# Patient Record
Sex: Female | Born: 1935 | Race: White | Hispanic: No | Marital: Married | Smoking: Never smoker
Health system: Southern US, Community
[De-identification: ages and names within clinical notes are randomized; demographics above are authoritative.]

## PROBLEM LIST (undated history)

## (undated) DIAGNOSIS — F419 Anxiety disorder, unspecified: Secondary | ICD-10-CM

## (undated) DIAGNOSIS — R6521 Severe sepsis with septic shock: Secondary | ICD-10-CM

## (undated) DIAGNOSIS — I10 Essential (primary) hypertension: Secondary | ICD-10-CM

## (undated) DIAGNOSIS — F250 Schizoaffective disorder, bipolar type: Secondary | ICD-10-CM

## (undated) DIAGNOSIS — C801 Malignant (primary) neoplasm, unspecified: Secondary | ICD-10-CM

## (undated) DIAGNOSIS — M179 Osteoarthritis of knee, unspecified: Secondary | ICD-10-CM

## (undated) DIAGNOSIS — M1710 Unilateral primary osteoarthritis, unspecified knee: Secondary | ICD-10-CM

## (undated) DIAGNOSIS — K5730 Diverticulosis of large intestine without perforation or abscess without bleeding: Secondary | ICD-10-CM

## (undated) DIAGNOSIS — E785 Hyperlipidemia, unspecified: Secondary | ICD-10-CM

## (undated) DIAGNOSIS — A4151 Sepsis due to Escherichia coli [E. coli]: Secondary | ICD-10-CM

## (undated) HISTORY — DX: Essential (primary) hypertension: I10

## (undated) HISTORY — DX: Anxiety disorder, unspecified: F41.9

## (undated) HISTORY — DX: Hyperlipidemia, unspecified: E78.5

## (undated) HISTORY — DX: Schizoaffective disorder, bipolar type: F25.0

## (undated) HISTORY — DX: Severe sepsis with septic shock: R65.21

## (undated) HISTORY — DX: Osteoarthritis of knee, unspecified: M17.9

## (undated) HISTORY — DX: Sepsis due to Escherichia coli (e. coli): A41.51

## (undated) HISTORY — PX: APPENDECTOMY: SHX54

## (undated) HISTORY — PX: CHOLECYSTECTOMY: SHX55

## (undated) HISTORY — DX: Diverticulosis of large intestine without perforation or abscess without bleeding: K57.30

## (undated) HISTORY — DX: Malignant (primary) neoplasm, unspecified: C80.1

## (undated) HISTORY — DX: Unilateral primary osteoarthritis, unspecified knee: M17.10

---

## 2002-11-03 DIAGNOSIS — A4151 Sepsis due to Escherichia coli [E. coli]: Secondary | ICD-10-CM

## 2002-11-03 DIAGNOSIS — R6521 Severe sepsis with septic shock: Secondary | ICD-10-CM

## 2002-11-03 HISTORY — DX: Sepsis due to Escherichia coli (e. coli): R65.21

## 2002-11-03 HISTORY — DX: Severe sepsis with septic shock: A41.51

## 2004-06-22 ENCOUNTER — Other Ambulatory Visit: Payer: Self-pay

## 2004-06-25 ENCOUNTER — Other Ambulatory Visit: Payer: Self-pay

## 2004-10-30 ENCOUNTER — Ambulatory Visit: Payer: Self-pay | Admitting: Internal Medicine

## 2004-11-08 ENCOUNTER — Ambulatory Visit: Payer: Self-pay | Admitting: Internal Medicine

## 2005-04-08 ENCOUNTER — Emergency Department: Payer: Self-pay | Admitting: Emergency Medicine

## 2005-10-27 ENCOUNTER — Inpatient Hospital Stay: Payer: Self-pay | Admitting: Internal Medicine

## 2005-10-30 ENCOUNTER — Inpatient Hospital Stay: Payer: Self-pay | Admitting: Psychiatry

## 2006-02-26 ENCOUNTER — Ambulatory Visit: Payer: Self-pay | Admitting: Urology

## 2006-03-24 ENCOUNTER — Ambulatory Visit: Payer: Self-pay | Admitting: Vascular Surgery

## 2006-04-25 ENCOUNTER — Emergency Department (HOSPITAL_COMMUNITY): Admission: EM | Admit: 2006-04-25 | Discharge: 2006-04-25 | Payer: Self-pay | Admitting: Emergency Medicine

## 2006-04-28 ENCOUNTER — Inpatient Hospital Stay (HOSPITAL_COMMUNITY): Admission: RE | Admit: 2006-04-28 | Discharge: 2006-05-05 | Payer: Self-pay | Admitting: Psychiatry

## 2006-04-29 ENCOUNTER — Ambulatory Visit: Payer: Self-pay | Admitting: Psychiatry

## 2006-07-13 ENCOUNTER — Ambulatory Visit: Payer: Self-pay | Admitting: Family Medicine

## 2006-10-03 ENCOUNTER — Encounter: Payer: Self-pay | Admitting: Family Medicine

## 2006-10-03 LAB — CONVERTED CEMR LAB
Hgb A1c MFr Bld: 7.4 %
Microalbumin U total vol: 7.4 mg/L

## 2006-10-08 ENCOUNTER — Ambulatory Visit: Payer: Self-pay | Admitting: Family Medicine

## 2006-10-12 ENCOUNTER — Ambulatory Visit: Payer: Self-pay | Admitting: Family Medicine

## 2006-11-09 ENCOUNTER — Ambulatory Visit: Payer: Self-pay | Admitting: Family Medicine

## 2006-12-15 ENCOUNTER — Ambulatory Visit: Payer: Self-pay | Admitting: Family Medicine

## 2007-01-16 IMAGING — CT CT HEAD WITHOUT CONTRAST
2 series · 15 of 30 positions shown, 19 images · non-contrast
Comparison: none

REASON FOR EXAM: Rule out bleed, pt. fell
COMMENTS:  LMP: Post-Menopausal

[Series 2: without · axial · non-contrast · 0.39mm/px · z∈[+426,+546]mm · 13 of 30 slices shown, 17 images]
[im 3/30  brain]
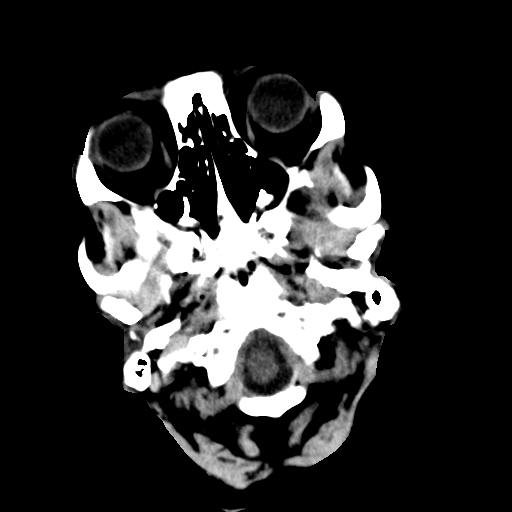
[im 3/30  bone]
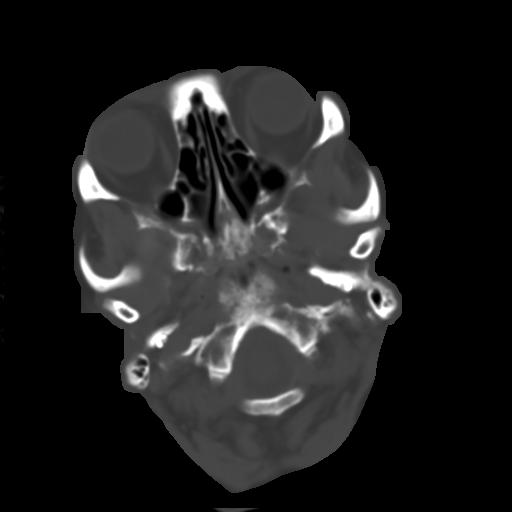
[im 5/30  brain]
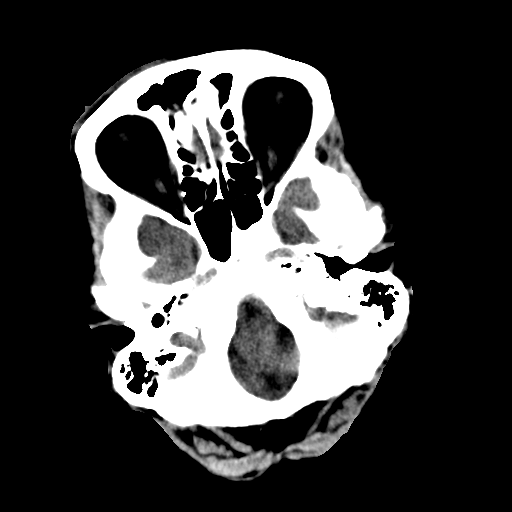
[im 7/30  brain]
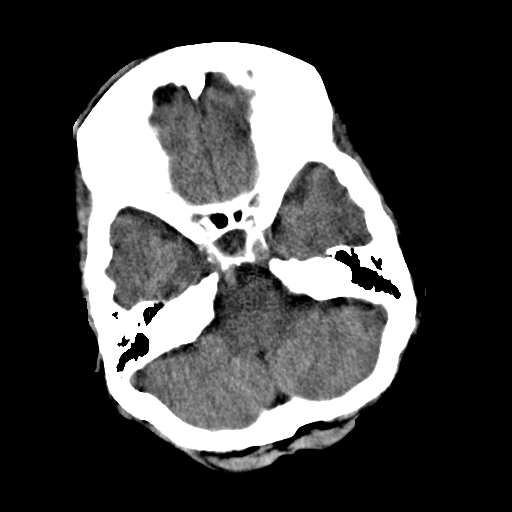
[im 9/30  brain]
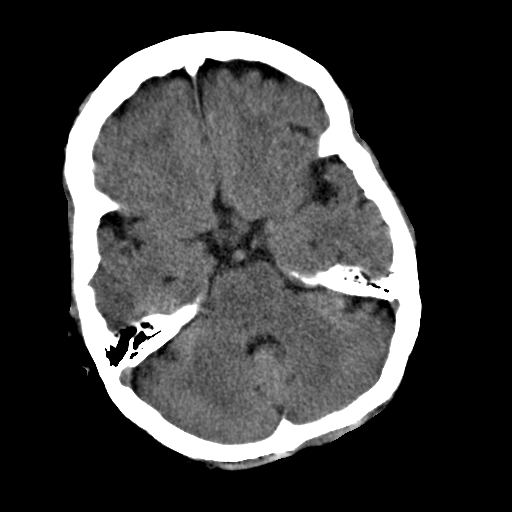
[im 11/30  brain]
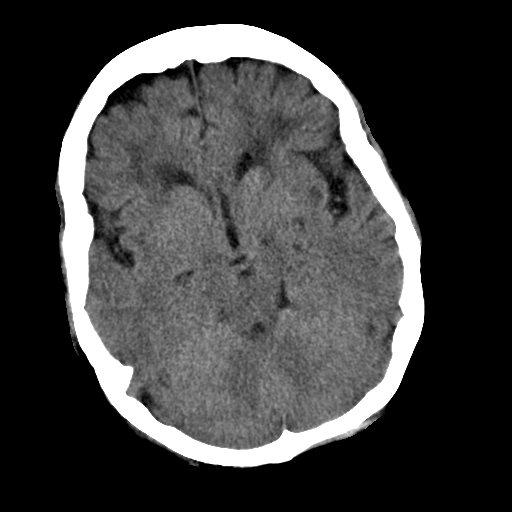
[im 11/30  bone]
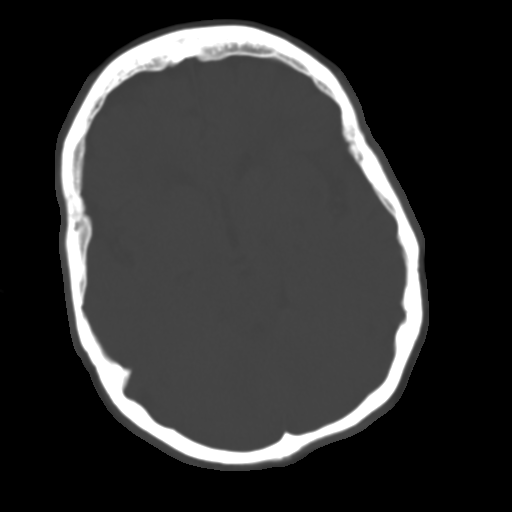
[im 13/30  brain]
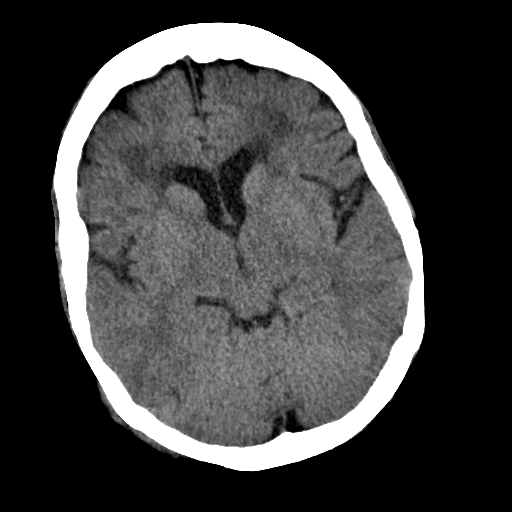
[im 15/30  brain]
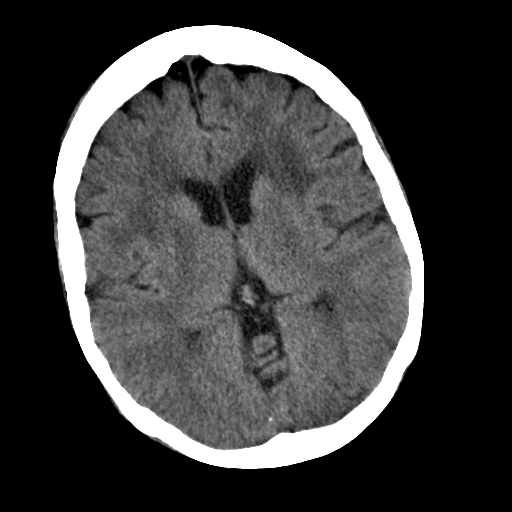
[im 17/30  brain]
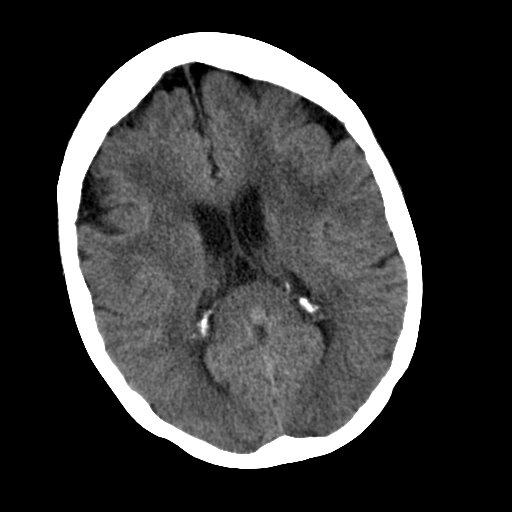
[im 19/30  brain]
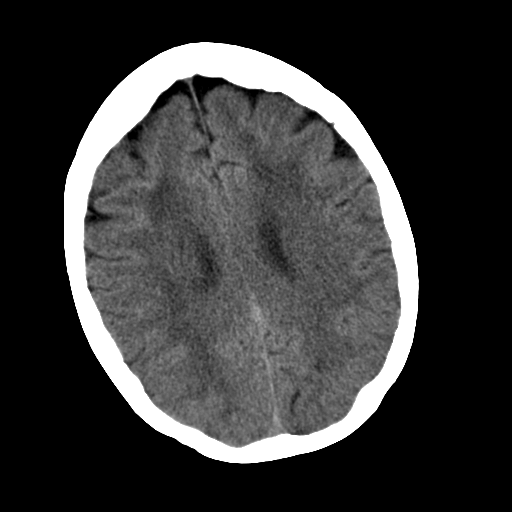
[im 19/30  bone]
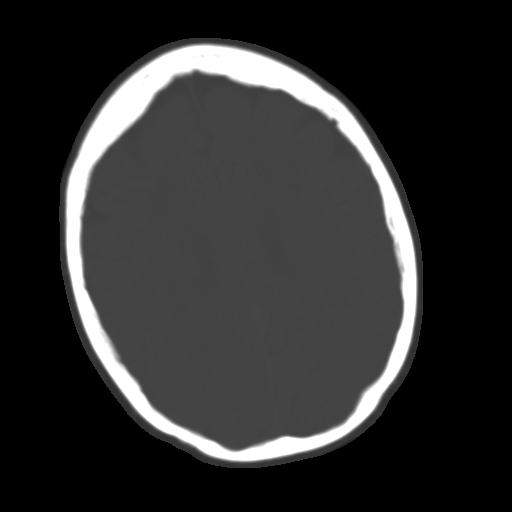
[im 21/30  brain]
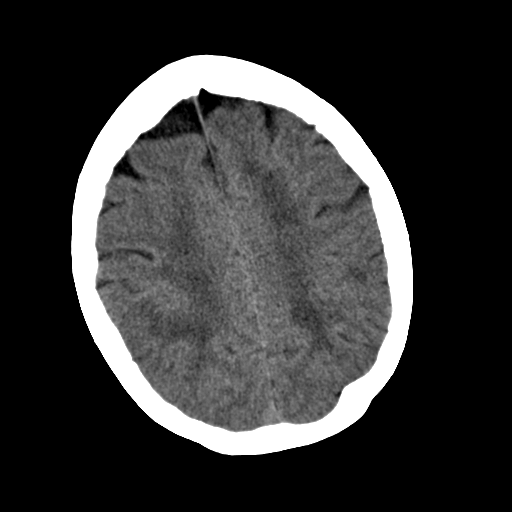
[im 23/30  brain]
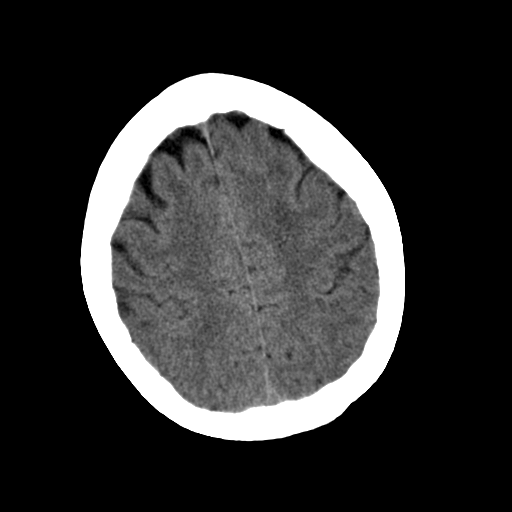
[im 25/30  brain]
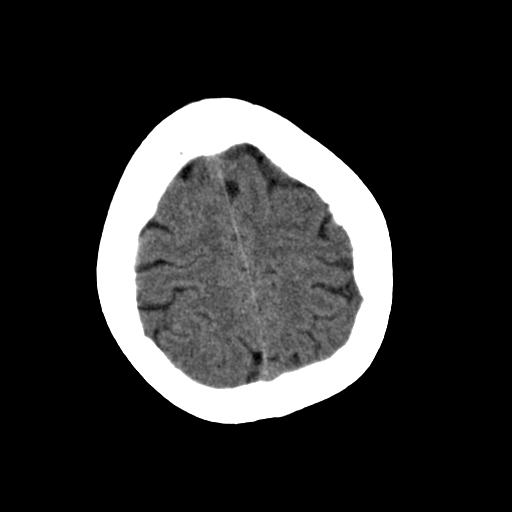
[im 27/30  brain]
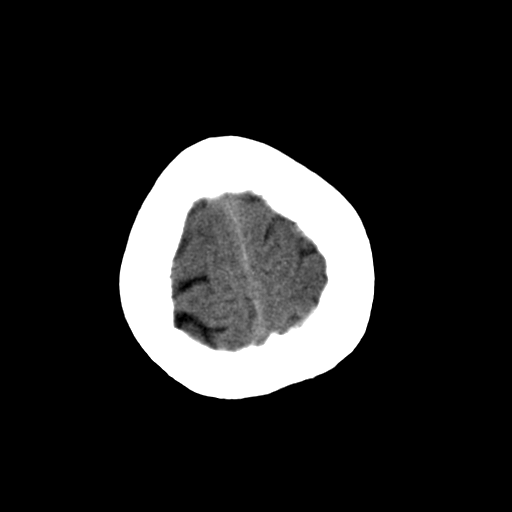
[im 27/30  bone]
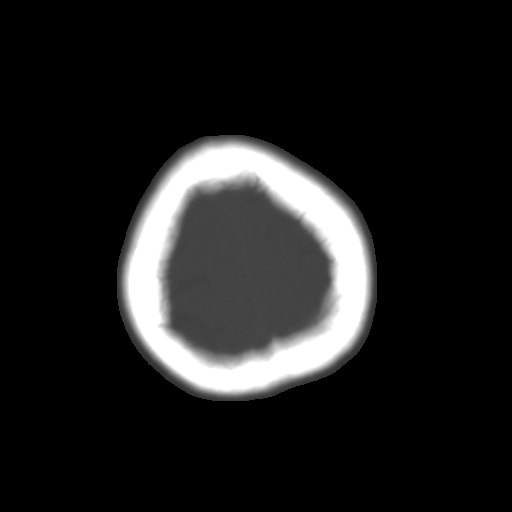

[Series 3: bone · axial · 0.39mm/px · z∈[+426,+446]mm · 2 of 30 slices shown]
[im 3/30  bone]
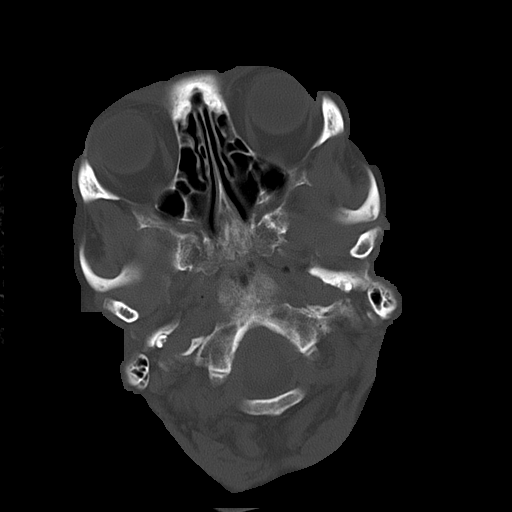
[im 7/30  bone]
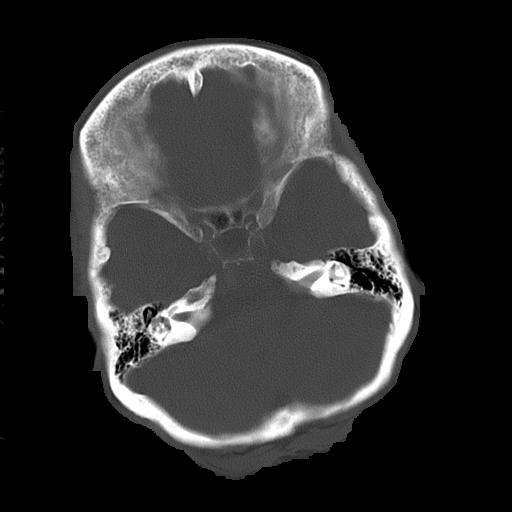

[15 of 30 positions shown; findings below may reference images not displayed]

PROCEDURE:     CT  - CT HEAD WITHOUT CONTRAST  - October 29, 2005  [DATE]

RESULT:       Comparison is made to the patient's prior study dated
10/28/05.  There appears to be increased soft tissue attenuation over the
RIGHT periorbital region consistent with subcutaneous hematoma.  The
ventricles and sulci are prominent consistent with atrophy and there is
ill-defined low density in the periventricular and subcortical white matter
which is nonspecific but most likely represents chronic microvascular
ischemic disease.  There is no evidence of parenchymal or extra-axial
hemorrhage.  There is no mass effect or midline shift.  No intraventricular
hemorrhage is evident.
IMPRESSION: 1.     Chronic microvascular ischemic disease.  No acute intracranial
abnormality.
2.     Bone window images do not show evidence of skull fracture.  The
mastoid air cells and paranasal sinuses included in the study appear to be
normally aerated.

## 2007-01-18 ENCOUNTER — Ambulatory Visit: Payer: Self-pay | Admitting: Family Medicine

## 2007-04-15 ENCOUNTER — Encounter: Payer: Self-pay | Admitting: Family Medicine

## 2007-04-15 DIAGNOSIS — F411 Generalized anxiety disorder: Secondary | ICD-10-CM | POA: Insufficient documentation

## 2007-04-15 DIAGNOSIS — IMO0002 Reserved for concepts with insufficient information to code with codable children: Secondary | ICD-10-CM | POA: Insufficient documentation

## 2007-04-15 DIAGNOSIS — E119 Type 2 diabetes mellitus without complications: Secondary | ICD-10-CM | POA: Insufficient documentation

## 2007-04-16 ENCOUNTER — Encounter (INDEPENDENT_AMBULATORY_CARE_PROVIDER_SITE_OTHER): Payer: Self-pay | Admitting: *Deleted

## 2007-04-19 ENCOUNTER — Ambulatory Visit: Payer: Self-pay | Admitting: Family Medicine

## 2007-07-08 ENCOUNTER — Ambulatory Visit: Payer: Self-pay | Admitting: Internal Medicine

## 2007-09-02 ENCOUNTER — Ambulatory Visit: Payer: Self-pay | Admitting: Internal Medicine

## 2007-09-10 ENCOUNTER — Ambulatory Visit: Payer: Self-pay | Admitting: Internal Medicine

## 2007-09-21 ENCOUNTER — Ambulatory Visit: Payer: Self-pay | Admitting: Internal Medicine

## 2007-10-25 ENCOUNTER — Inpatient Hospital Stay: Payer: Self-pay | Admitting: Unknown Physician Specialty

## 2007-10-25 ENCOUNTER — Other Ambulatory Visit: Payer: Self-pay

## 2007-11-12 ENCOUNTER — Encounter: Payer: Self-pay | Admitting: Family Medicine

## 2008-01-02 DIAGNOSIS — K5730 Diverticulosis of large intestine without perforation or abscess without bleeding: Secondary | ICD-10-CM

## 2008-01-02 HISTORY — DX: Diverticulosis of large intestine without perforation or abscess without bleeding: K57.30

## 2008-01-13 ENCOUNTER — Ambulatory Visit: Payer: Self-pay | Admitting: Gastroenterology

## 2008-01-18 LAB — HM COLONOSCOPY

## 2008-10-19 ENCOUNTER — Ambulatory Visit: Payer: Self-pay | Admitting: Internal Medicine

## 2009-01-11 IMAGING — CT CT HEAD WITHOUT CONTRAST
2 series · 15 of 30 positions shown, 19 images · non-contrast
Comparison: none

REASON FOR EXAM: ams
COMMENTS:

[Series 2: without · axial · non-contrast · 0.38mm/px · z∈[+928,+1048]mm · 13 of 29 slices shown, 17 images]
[im 3/29  brain]
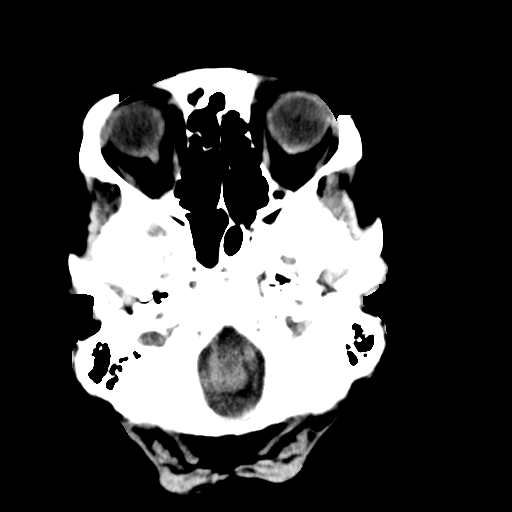
[im 3/29  bone]
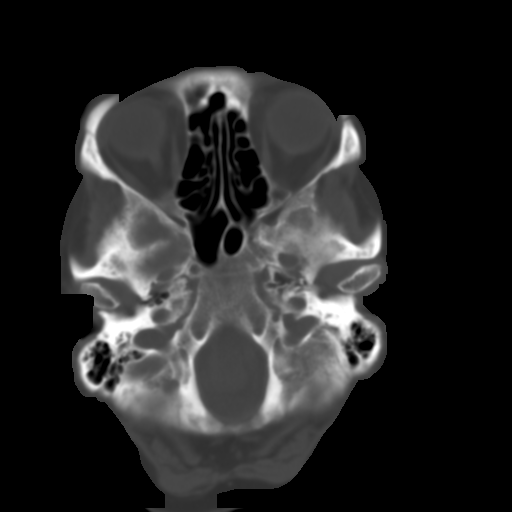
[im 5/29  brain]
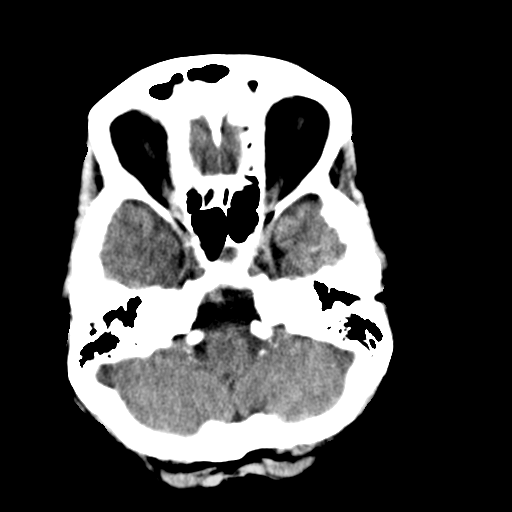
[im 7/29  brain]
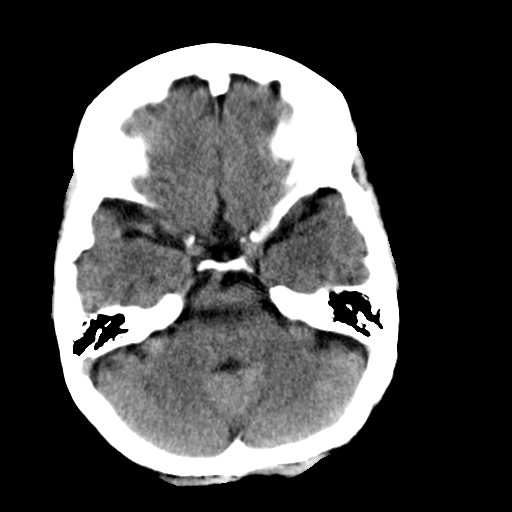
[im 9/29  brain]
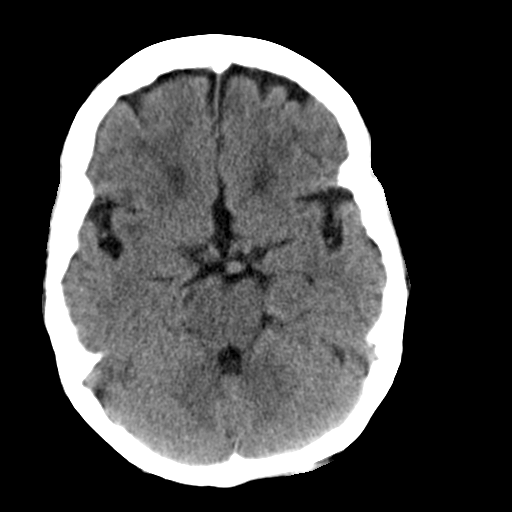
[im 11/29  brain]
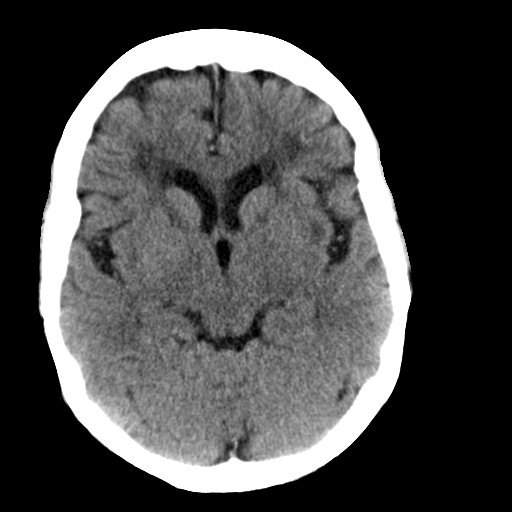
[im 11/29  bone]
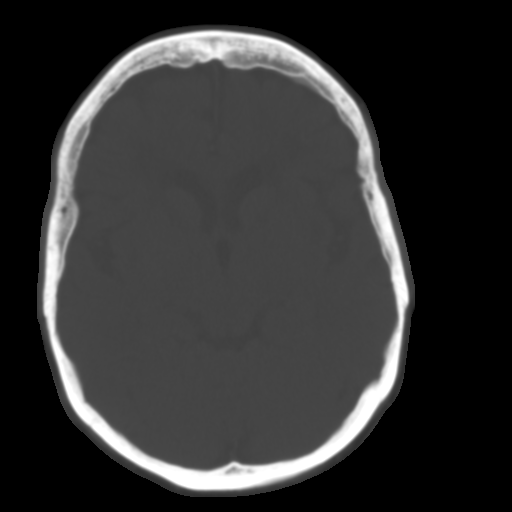
[im 13/29  brain]
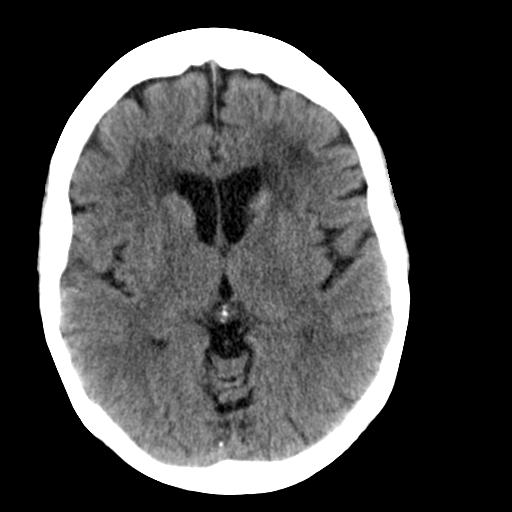
[im 15/29  brain]
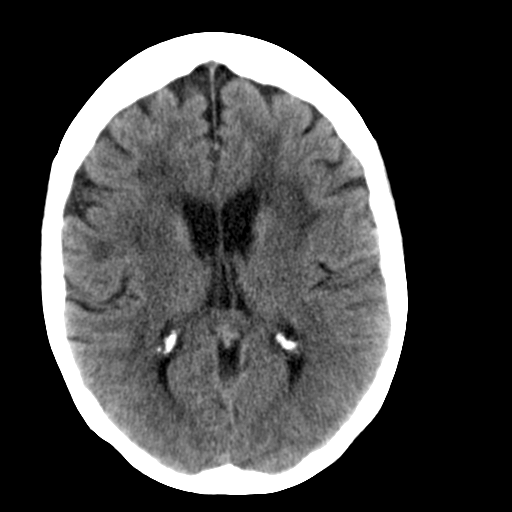
[im 17/29  brain]
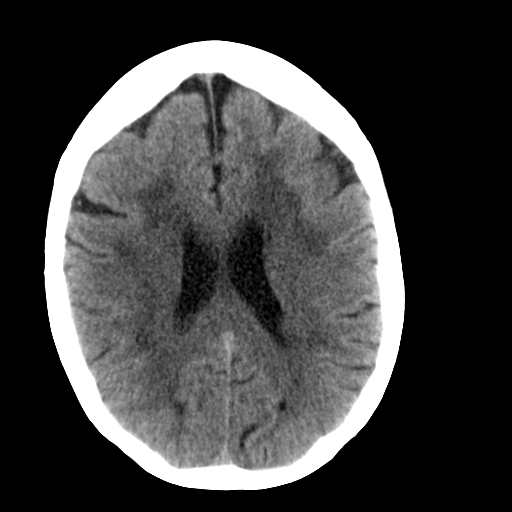
[im 19/29  brain]
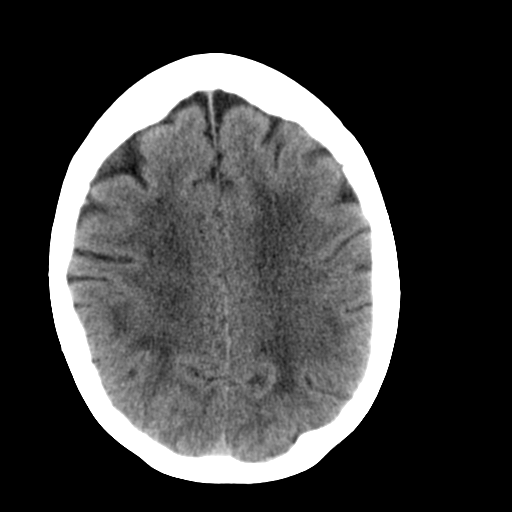
[im 19/29  bone]
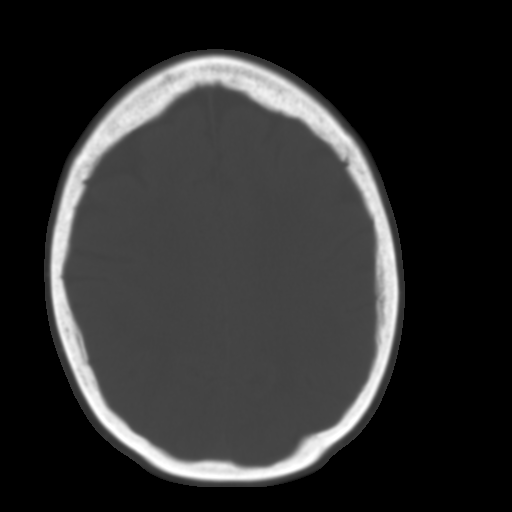
[im 21/29  brain]
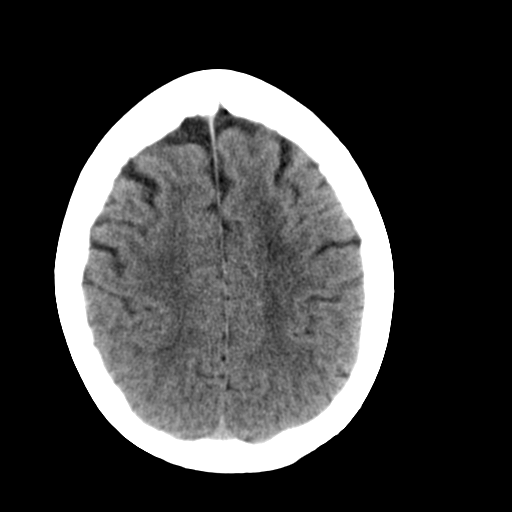
[im 23/29  brain]
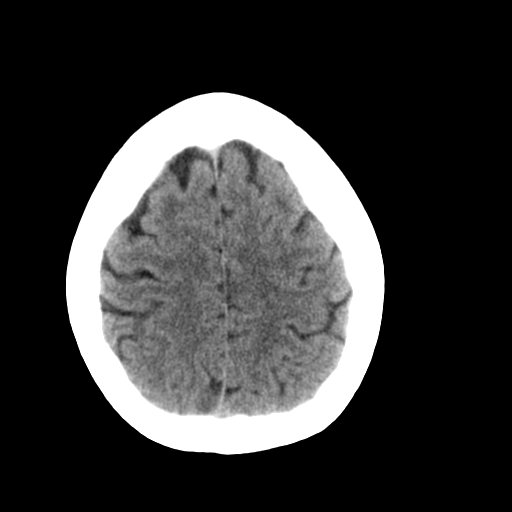
[im 25/29  brain]
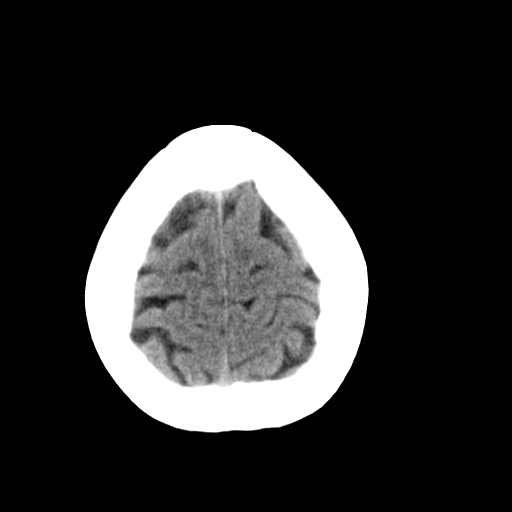
[im 27/29  brain]
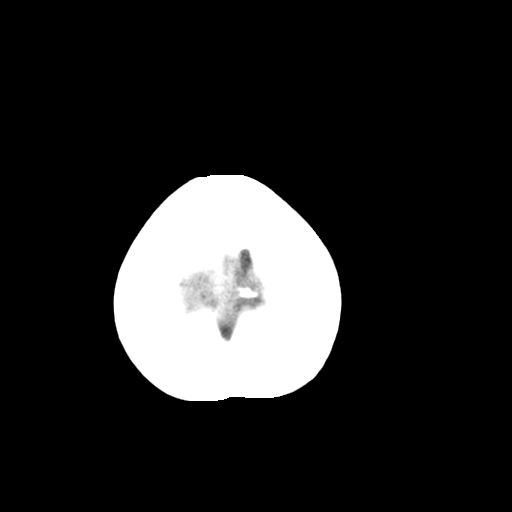
[im 27/29  bone]
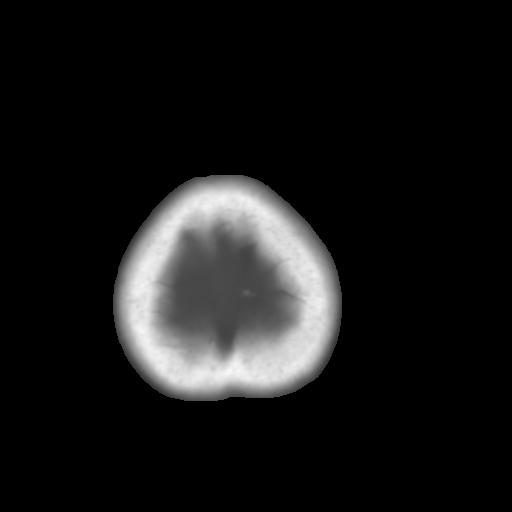

[Series 3: bone · axial · 0.38mm/px · z∈[+928,+948]mm · 2 of 29 slices shown]
[im 3/29  bone]
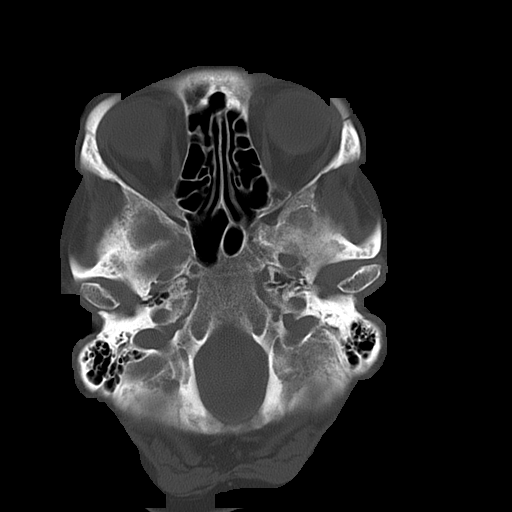
[im 7/29  bone]
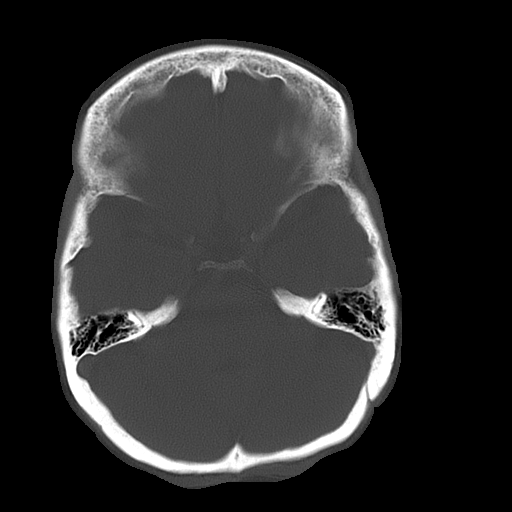

[15 of 30 positions shown; findings below may reference images not displayed]

PROCEDURE:     CT  - CT HEAD WITHOUT CONTRAST  - October 25, 2007 [DATE]

RESULT:     The ventricles are normal in size and position. There is mild
age-appropriate diffuse cerebral and cerebellar atrophy. There is no
intracranial hemorrhage, mass, or mass-effect. There is hypodensity in the
periventricular deep white matter consistent with chronic small vessel
ischemic change of aging.. At bone window settings the observed portions of
the paranasal sinuses are clear. There are no lytic or blastic bony lesions
identified.
IMPRESSION: 1. There is no evidence of an acute ischemic or hemorrhagic event.
2. There is no evidence of an intracranial mass or hydrocephalus.
3. There are age appropriate atrophic changes and deep white matter changes.
No significant interval change has occurred since study 29 October, 2005.

This report was called to the emergency department the conclusion of the
study.

## 2009-11-14 ENCOUNTER — Ambulatory Visit: Payer: Self-pay | Admitting: Internal Medicine

## 2010-03-03 DIAGNOSIS — C801 Malignant (primary) neoplasm, unspecified: Secondary | ICD-10-CM

## 2010-03-03 HISTORY — DX: Malignant (primary) neoplasm, unspecified: C80.1

## 2010-03-22 ENCOUNTER — Ambulatory Visit: Payer: Self-pay | Admitting: General Surgery

## 2010-03-27 ENCOUNTER — Ambulatory Visit: Payer: Self-pay | Admitting: General Surgery

## 2010-06-11 ENCOUNTER — Encounter (INDEPENDENT_AMBULATORY_CARE_PROVIDER_SITE_OTHER): Payer: Self-pay | Admitting: *Deleted

## 2010-12-03 NOTE — Letter (Signed)
Summary: Nadara Eaton letter  [employer] [facility]  [address] [place], [place] [address]   Phone: [phone]  Fax: [phone]       06/11/2010 MRN: [id number]  [address] [facility] [address] [place], [place]  [address]  Dear Ms. Binney,  [facility] [place] [place] announce the retirement of Arta Silence, M.D., from full-time practice at the [facility] office effective May 02, 2010 and his plans of returning part-time.  It is important to Dr. Hetty Ely and to our practice that you understand that [facility] has seven physicians in our office for your health care needs.  We will continue to offer the same exceptional care that you have today.    Dr. Hetty Ely has spoken to many of you about his plans for retirement and returning part-time in the fall.   We will continue to work with you through the transition to schedule appointments for you in the office and meet the high standards that [facility] is committed to.   Again, it is with great pleasure that we share the news that Dr. Hetty Ely will return to [facility] in October of 2011 with a reduced schedule.    If you have any questions, or would like to request an appointment with one of our physicians, please call us at [phone] and press the option for Scheduling an appointment.  We take pleasure in providing you with excellent patient care and look forward to seeing you at your next office visit.  Our [facility] Physicians are:  Tillman Abide, M.D. Laurita Quint, M.D. Roxy Manns, M.D. Kerby Nora, M.D. Hannah Beat, M.D. Ruthe Mannan, M.D. We proudly welcomed Raechel Ache, M.D. and Eustaquio Boyden, M.D. to the practice in July/August 2011.  Sincerely,  [facility]

## 2011-03-14 ENCOUNTER — Ambulatory Visit: Payer: Self-pay | Admitting: Internal Medicine

## 2011-03-14 LAB — HM MAMMOGRAPHY: HM Mammogram: NORMAL

## 2011-03-21 NOTE — Assessment & Plan Note (Signed)
[facility]                             [place] OFFICE NOTE   Margaret Salazar, Margaret Salazar                     MRN:          [id number]  DATE:07/13/2006                            DOB:          1932/01/10    A 77-two-year-old white female, new to practice, here to establish, with no  complaints.  She gets urinary tract infections frequently and they go to my  brain.  What she means is, she gets septic and then has problems with  knowing where she is and what she is doing.  She has been put on Zyprexa and  Klonopin in the past, which she thinks works because I am nervous and that  helps her.  She regularly sees Dr. Tiburcio Pea OB/GYN.  Last appointment with Dr.  Tiburcio Pea was approximately September of last year.   PAST MEDICAL HISTORY:  Hospitalized at [facility] in December 2006  for urinary sepsis; was treated medically for a few days and then  transferred to the psychiatric unit where she was treated.  She had right  hip pain secondary to a fall incurred there, and then got a recurrent  urinary tract infection after being treated in the psychiatric unit.  She  also was admitted for microhematuria with a workup in the past with a CT  scan that showed pancreatitis; was referred to a surgeon, which the  pancreatitis was ascertained to be a shadow on the CT, and she was followed  medically.  She also was hospitalized at [facility] in the  psychiatric unit by Dr. Mack Guise in the past.  She had her gallbladder  removed in 1962, an appendectomy at 77 years of age.  She denies rheumatic  fever, scarlet fever, strep infection, high blood pressure, heart disease,  asthma, pneumonia, diabetes, liver disease or thyroid disease.  She does not  know her cholesterol.   SHE IS ALLERGIC TO CODEINE WHICH CAUSES SWELLING OF THE FACE AND NOSE.   She has never smoked and does not drink or use drugs.   MEDICINES INCLUDE:  Zyprexa 5 mg a day and Klonopin 1 mg  b.i.d., both of  which she is out of right now.  Prior to the Klonopin, she took Valium for  28 years.  She takes generic Darvocet-N 100 for her knee pain, which has  been worked up by Dr. Reita Chard at [facility] and was found to  have Baker's cysts in the past, which were treated in an unknown fashion.  She also is on nitrofurantoin 100 mg a day, prophylactically, for recurrent  urinary infections.   REVIEW OF SYSTEMS:  Significant for resolved migraines; syncope at the  hospital with sepsis in the past; status post LASIK surgery in 1995, which  when her last exam was.  Last dental exam was January of this year.  She has  chronic cough in the past during this spring; has allergic rhinitis; flex  sig in 1991 at [facility] that was normal; microhematuria workup in the past that  was negative with frequent urinary infections for which she gets septic,  usually, because  she cannot tell if she has a urinary infection.  She is a  G2, P2 with her last Pap smear in September 2006; no abnormals in the past.  Her last mammogram was September 2006, as well.  She has had thickening of  her right breast and she has been menopausal since 77 years of age.  Otherwise, head, eyes, ears, nose and throat, teeth and gums  cardiorespiratory, gastrointestinal, GI, GU and gynecologic systems are  noncontributory.   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  Married and  lives with husband; has 2 children out of the home.   FAMILY HISTORY:  Father died at the age of 66 of heart disease, coronary  disease, and stroke.  Mother died at the age of 63 of stroke.  One brother  alive at 79 with lymphoma that was treated years ago successfully.  One  sister died at 63 years of age of a lymphoma.  Two sisters alive, one at 29,  one at 59; the younger diabetes with thyroid disease.  Last tetanus shot is  in 2002.   PHYSICAL EXAMINATION:  GENERAL:  This is a well-developed, well-nourished  77-  year-old white female in no acute distress.  VITAL SIGNS:  Temperature 97.4, pulse of 88, blood pressure 114/74, weight  of 134, 62 inches.  ALLERGY:  WITH ALLERGIES TO CODEINE.  HEENT:  Within normal limits.  NECK:  Without adenopathy.  THYROID:  Without nodularity.  LUNGS:  Clear to auscultation.  BACK:  Straight, nontender with no CVA tenderness.  HEART:  Regular rate and rhythm without murmur.  Pulses are 2+ throughout.  Carotids are without bruits.  CHEST:  Symmetric with good excursion.  ABDOMEN: Soft, nontender.  Good bowel sounds and no masses.  BREASTS, VAGINAL, AND RECTAL:  Exams were definitive therapy Dr. Tiburcio Pea.  EXTREMITIES:  Without clubbing, cyanosis, or edema.  Muscle strength is 5/5  with range of motion and gait mobility normal.  SKIN:  Significant only for actinic keratoses.   ASSESSMENT:  Frequent urinary tract infections with history of multiple  sepsis following suppression therapy, anxiety and agitation, chronic knee  pain, allergic rhinitis.   PLAN:  A trial of Astelin 2 sprays each nostril twice a day for a week and  then 1 spray each nostril twice a day; was given samples; call if it helps  and we will call in a prescription.  Back on her Zyprexa at 5 mg a day and  her Klonopin 1 mg t.i.d.; prescription for 30 and 12 and 90 and 2,  respectively.  Return in 3 months for recheck, sooner if symptoms require.                                   Arta Silence, MD   RNS/MedQ  DD:  07/13/2006  DT:  07/14/2006  Job #:  [id number]

## 2011-03-21 NOTE — Discharge Summary (Signed)
Margaret Salazar, Margaret Salazar              ACCOUNT NO.:  [id number]   MEDICAL RECORD NO.:  [id number]          PATIENT TYPE:  IPS   LOCATION:  0404                          FACILITY:  [facility]   PHYSICIAN:  Geoffery Lyons, M.D.      DATE OF BIRTH:  1932/10/15   DATE OF ADMISSION:  04/28/2006  DATE OF DISCHARGE:  05/05/2006                                 DISCHARGE SUMMARY   CHIEF COMPLAINT AND PRESENT ILLNESS:  It is the first admission to [facility] for this 77 year old married white female brought in  and admitted with manic behavior April 25, 2006, was disorganized,  combative, was given Versed and Ativan 2 mg and released to the care of her  daughter.  She continued to be disorganized, not able to sleep, brought back  to the hospital, disorganized in thought, rambling, nonpressured speech,  poor short term recall and history of mental problems, taking vitamin B12.   PAST PSYCHIATRIC HISTORY:  Margaret Salazar __________ , M.D. in [place].  Four  prior hospitalizations at [facility].  First time at  [facility].   According to history no active use of any substances.   MEDICAL HISTORY:  Urinary tract infection.   MEDICATIONS:  Vitamin B12 shots according to her report.   PHYSICAL EXAMINATION:  Performed but did not show any acute findings.   LABORATORY WORKUP:  Blood chemistry:  Sodium 136, potassium 3.4, glucose  153.  Hematocrit 47, hemoglobin 16, white blood cells 10.6.  Urinalysis:  Nitrite negative, leukocyte esterase moderate.  Urine:  White blood cells 3-  6 per high-power field.  Drug screening positive for benzodiazepines.   MENTAL STATUS EXAM:  Reveals a fully alert, cooperative female, __________,  pleasant, appropriate but disorganized.  She is rambling, mumbling, fast  paced, normal tone, fast production.  Mood anxious, somewhat irritable,  upset with being in the unit, issues of trust, not wanting medications.  Somewhat disorganized.   Cognition well preserved.   ADMISSION DIAGNOSES:  Axis I:  Bipolar disorder.  Axis II:  No diagnosis.  Axis III:  Rule out urinary tract infection.  Axis IV:  Moderate.  Axis V:  Upon admission 30, GAF in the last year is 65.   COURSE IN HOSPITAL:  She was admitted.  She was started in individual  psychotherapy.  She was initially given some Zyprexa at bedtime.  She was  given Ativan as needed.  She was also given some Klonopin.  Initially she  was agitated, irrational thinking and behavior, disorganized, pressure of  speech.  Endorsed none of the symptoms that led to her admission.  Claimed  that she was poisoned by Dr. Lenis Salazar,  according to my mother as she was  given Seroquel by him.  She gained weight, saying that Dr. Lenis Salazar was trying  to kill her.  Does not trust any of the psychiatrists in town. Diagnosed  with schizoaffective disorder, not compliant with medications, endorsed that  she __________,  some grandiosity, living with the husband and claims a good  relationship with the husband.  Her son is 90  years old.  He has been having  some difficulties, claimed drugs and some other legal issues, worried about  him.  Claimed that her husband and her daughter see things the same way and  they usually gang against her and thought that she needed to be unit.  She  denies __________ , cannot validate the need to be in the hospital.   On evaluation though she is __________  disorganized __________  up to being  tangential.  She continued to evidence some pressured speech, somewhat  superficial.  She wanted to be discharged but she was at the same time  compliant with the medication that she was being given.  There was a lot of  worrying, a lot of rumination about relationship with the husband, though  she needed to be in the unit.  __________  circumstantial, some grandiosity.  She continued to get more stable.  She was compliant with medications.  May 04, 2006 she still __________   she wanted to be discharged.  Objectively she  was less circumstantial, less tangential and thus she was sleeping better.  She was willing to continue the medications.  So by May 05, 2006 she was  most possibly baseline.  She was in full contact with reality, in many ways  better, did not sleep, anticipating discharge, wanting to be home.  She was  willing to pursue the medications.  There were no acute suicidal/homicidal  ideations for what we went ahead and discharged to outpatient followup.   DISCHARGE DIAGNOSES:  Axis I.  Schizoaffective disorder.  Axis II:  No diagnosis.  Axis III:  Urinary tract infection.  Axis IV:  Moderate.  Axis V:  Upon discharge 50.   __________  1 mg twice a day, Macrodantin 100 daily, Ambien CR 12.5 at  bedtime as needed.  Follow with Dr. __________, psychiatrist in [place].      Geoffery Lyons, M.D.  Electronically Signed     IL/MEDQ  D:  05/18/2006  T:  05/19/2006  Job:  [id number]

## 2011-05-28 ENCOUNTER — Ambulatory Visit: Payer: Self-pay | Admitting: Internal Medicine

## 2011-06-10 ENCOUNTER — Encounter: Payer: Self-pay | Admitting: Internal Medicine

## 2011-06-27 ENCOUNTER — Other Ambulatory Visit: Payer: Self-pay | Admitting: Internal Medicine

## 2011-06-27 MED ORDER — CARISOPRODOL 350 MG PO TABS: 350.0000 mg | ORAL_TABLET | Freq: Three times a day (TID) | ORAL | Status: AC | PRN

## 2011-07-25 ENCOUNTER — Other Ambulatory Visit: Payer: Self-pay | Admitting: Internal Medicine

## 2011-07-26 MED ORDER — CARISOPRODOL 350 MG PO TABS: 350.0000 mg | ORAL_TABLET | Freq: Three times a day (TID) | ORAL | Status: DC | PRN

## 2011-07-28 MED ORDER — CARISOPRODOL 350 MG PO TABS: 350.0000 mg | ORAL_TABLET | Freq: Three times a day (TID) | ORAL | Status: AC | PRN

## 2011-08-01 ENCOUNTER — Other Ambulatory Visit: Payer: Self-pay | Admitting: Internal Medicine

## 2011-08-01 DIAGNOSIS — F319 Bipolar disorder, unspecified: Secondary | ICD-10-CM

## 2011-08-01 NOTE — Telephone Encounter (Signed)
Patient spouse called stating the patient requested this rx 3 days ago.Loletta Specter) Patient has been without meds for 2 days.

## 2011-08-02 MED ORDER — QUETIAPINE FUMARATE 50 MG PO TABS: 50.0000 mg | ORAL_TABLET | Freq: Two times a day (BID) | ORAL | Status: DC

## 2011-08-02 NOTE — Telephone Encounter (Signed)
I have revirwed the incoming refill requests and it was never received.  Done now.

## 2011-08-05 ENCOUNTER — Other Ambulatory Visit: Payer: Self-pay | Admitting: Internal Medicine

## 2011-08-11 ENCOUNTER — Other Ambulatory Visit: Payer: Self-pay | Admitting: Internal Medicine

## 2011-08-18 ENCOUNTER — Ambulatory Visit (INDEPENDENT_AMBULATORY_CARE_PROVIDER_SITE_OTHER): Payer: Self-pay | Admitting: Internal Medicine

## 2011-08-18 ENCOUNTER — Encounter: Payer: Self-pay | Admitting: Internal Medicine

## 2011-08-18 DIAGNOSIS — Z9889 Other specified postprocedural states: Secondary | ICD-10-CM | POA: Insufficient documentation

## 2011-08-18 DIAGNOSIS — E119 Type 2 diabetes mellitus without complications: Secondary | ICD-10-CM

## 2011-08-18 LAB — COMPREHENSIVE METABOLIC PANEL
ALT: 18 U/L (ref 0–35)
CO2: 25 mEq/L (ref 19–32)
Calcium: 9.6 mg/dL (ref 8.4–10.5)
Chloride: 104 mEq/L (ref 96–112)
Creatinine, Ser: 0.7 mg/dL (ref 0.4–1.2)
GFR: 90.53 mL/min (ref >60.00)
Total Protein: 7.7 g/dL (ref 6.0–8.3)

## 2011-08-18 LAB — HEMOGLOBIN A1C: Hgb A1c MFr Bld: 7.1 % — ABNORMAL HIGH (ref 4.6–6.5)

## 2011-08-18 LAB — MICROALBUMIN / CREATININE URINE RATIO: Microalb Creat Ratio: 1 mg/g (ref 0.0–30.0)

## 2011-08-18 NOTE — Patient Instructions (Addendum)
Make sure you sign a release from so we can obtain your old records from our previous office.   You should get a flu vaccine and a pneumonia vaccine at [facility] this Fall.   I also recommend the TdaP (tetanus, diptheria, pertussis) vaccine which is availabe at the [facility].   We will call you with your lab results.

## 2011-08-18 NOTE — Progress Notes (Signed)
  Subjective:    Patient ID: Margaret Salazar, female    DOB: 23-May-1932, 77 y.o.   MRN: [id number]  HPI    Review of Systems     Objective:   Physical Exam        Assessment & Plan:   Subjective:     Margaret Salazar is a 77 y.o. female who presents for follow up of diabetes.. Current symptoms include: none. Patient denies foot ulcerations, hypoglycemia  and paresthesia of the feet. Evaluation to date has been: fasting blood sugar, fasting lipid panel, hemoglobin A1C and microalbuminuria. Home sugars: patient does not check sugars. Current treatments: Continued metformin which has been effective. Last dilated eye exam over 2 years ago  The following portions of the patient's history were reviewed and updated as appropriate: allergies, current medications, past family history, past medical history, past social history, past surgical history and problem list.  Review of Systems A comprehensive review of systems was negative.    Objective:    BP 138/80  Pulse 88  Temp(Src) 98.7 F (37.1 C) (Oral)  Ht 5\' 2"  (1.575 m)  Wt 135 lb 8 oz (61.462 kg)  BMI 24.78 kg/m2 General appearance: alert Eyes: conjunctivae/corneas clear. PERRL, EOM's intact. Fundi benign. Throat: lips, mucosa, and tongue normal; teeth and gums normal Neck: no adenopathy, no carotid bruit, no JVD, supple, symmetrical, trachea midline and thyroid not enlarged, symmetric, no tenderness/mass/nodules Lungs: clear to auscultation bilaterally Heart: regular rate and rhythm, S1, S2 normal, no murmur, click, rub or gallop Abdomen: soft, non-tender; bowel sounds normal; no masses,  no organomegaly Extremities: extremities normal, atraumatic, no cyanosis or edema Pulses: 2+ and symmetric Skin: Skin color, texture, turgor normal. No rashes or lesions    @DMFOOTEXAM @  Patient was evaluated for proper footwear and sizing.  Laboratory: No components found with this basename: A1C      Assessment:    Diabetes  mellitus Type II, under excellent control.    Plan:    Discussed general issues about diabetes pathophysiology and management. Encouraged aerobic exercise. Reminded to get yearly retinal exam. Continued sulfonylurea; see medication orders.

## 2011-08-18 NOTE — Assessment & Plan Note (Signed)
Reminded to have her annual eye exam.  Last one was 2 yrs ago with dr bell, but she does not want  To see him again or go to [place]

## 2011-08-25 ENCOUNTER — Other Ambulatory Visit: Payer: Self-pay | Admitting: Internal Medicine

## 2011-08-25 MED ORDER — CARISOPRODOL 350 MG PO TABS: 350.0000 mg | ORAL_TABLET | Freq: Every day | ORAL | Status: DC

## 2011-08-25 NOTE — Telephone Encounter (Signed)
Pt need rx for carisoprodol 350 mg tablets take 1 tablet  By mouth 3x daily as needed for muscle spasams Please call pt when ready [phone]

## 2011-08-26 ENCOUNTER — Other Ambulatory Visit: Payer: Self-pay | Admitting: *Deleted

## 2011-08-26 MED ORDER — CARISOPRODOL 350 MG PO TABS: 350.0000 mg | ORAL_TABLET | Freq: Every day | ORAL | Status: DC

## 2011-08-27 ENCOUNTER — Telehealth: Payer: Self-pay | Admitting: Internal Medicine

## 2011-08-27 NOTE — Telephone Encounter (Signed)
Error

## 2011-09-15 ENCOUNTER — Other Ambulatory Visit: Payer: Self-pay | Admitting: Internal Medicine

## 2011-09-15 ENCOUNTER — Other Ambulatory Visit: Payer: Self-pay | Admitting: *Deleted

## 2011-09-15 MED ORDER — SIMVASTATIN 40 MG PO TABS: 40.0000 mg | ORAL_TABLET | Freq: Every day | ORAL | Status: DC

## 2011-09-16 MED ORDER — LISINOPRIL 5 MG PO TABS: 5.0000 mg | ORAL_TABLET | Freq: Every day | ORAL | Status: DC

## 2011-09-23 ENCOUNTER — Other Ambulatory Visit: Payer: Self-pay | Admitting: Internal Medicine

## 2011-09-23 ENCOUNTER — Telehealth: Payer: Self-pay | Admitting: Internal Medicine

## 2011-09-23 NOTE — Telephone Encounter (Signed)
Pt would like to get rx for pneumonia shot [employer] [facility] [address]

## 2011-09-23 NOTE — Telephone Encounter (Signed)
she does not need an rx for the pneumonia vaccine.

## 2011-09-26 NOTE — Telephone Encounter (Signed)
Patient notified

## 2011-10-02 ENCOUNTER — Telehealth: Payer: Self-pay | Admitting: Internal Medicine

## 2011-10-02 MED ORDER — PNEUMOCOCCAL 13-VAL CONJ VACC IM SUSP: 0.5000 mL | Freq: Once | INTRAMUSCULAR | Status: DC

## 2011-10-02 MED ORDER — PNEUMOCOCCAL VAC POLYVALENT 25 MCG/0.5ML IJ INJ: 0.5000 mL | INJECTION | Freq: Once | INTRAMUSCULAR | Status: DC

## 2011-10-02 NOTE — Telephone Encounter (Signed)
Order placed, send to pharmacy

## 2011-10-02 NOTE — Telephone Encounter (Signed)
Addended by: Duncan Dull on: 10/02/2011 04:17 PM   Modules accepted: Orders

## 2011-10-02 NOTE — Telephone Encounter (Signed)
Patient called again requesting that we send in an Rx for her and her husband to [facility] for the pneumonia vaccine.  I told her from the previous telephone call that you stated they do not need an Rx.  She kept saying the pharmacy said they do.  She stated she does not want to come to our office for the vaccine because she stated we charge too much.  Please advise.

## 2011-10-03 ENCOUNTER — Other Ambulatory Visit: Payer: Self-pay | Admitting: Internal Medicine

## 2011-10-03 NOTE — Telephone Encounter (Signed)
Rx has been called into pharmacy. Patient notified.

## 2011-10-07 ENCOUNTER — Other Ambulatory Visit: Payer: Self-pay | Admitting: Internal Medicine

## 2011-10-14 ENCOUNTER — Other Ambulatory Visit: Payer: Self-pay | Admitting: Internal Medicine

## 2011-11-24 ENCOUNTER — Encounter: Payer: Self-pay | Admitting: Internal Medicine

## 2011-11-24 ENCOUNTER — Ambulatory Visit (INDEPENDENT_AMBULATORY_CARE_PROVIDER_SITE_OTHER): Payer: 59 | Admitting: Internal Medicine

## 2011-11-24 DIAGNOSIS — M1710 Unilateral primary osteoarthritis, unspecified knee: Secondary | ICD-10-CM | POA: Insufficient documentation

## 2011-11-24 DIAGNOSIS — Z1211 Encounter for screening for malignant neoplasm of colon: Secondary | ICD-10-CM

## 2011-11-24 DIAGNOSIS — E785 Hyperlipidemia, unspecified: Secondary | ICD-10-CM | POA: Insufficient documentation

## 2011-11-24 DIAGNOSIS — C189 Malignant neoplasm of colon, unspecified: Secondary | ICD-10-CM | POA: Insufficient documentation

## 2011-11-24 DIAGNOSIS — F250 Schizoaffective disorder, bipolar type: Secondary | ICD-10-CM | POA: Insufficient documentation

## 2011-11-24 DIAGNOSIS — Z136 Encounter for screening for cardiovascular disorders: Secondary | ICD-10-CM

## 2011-11-24 DIAGNOSIS — F489 Nonpsychotic mental disorder, unspecified: Secondary | ICD-10-CM

## 2011-11-24 DIAGNOSIS — K5730 Diverticulosis of large intestine without perforation or abscess without bleeding: Secondary | ICD-10-CM | POA: Insufficient documentation

## 2011-11-24 DIAGNOSIS — E119 Type 2 diabetes mellitus without complications: Secondary | ICD-10-CM

## 2011-11-24 DIAGNOSIS — F319 Bipolar disorder, unspecified: Secondary | ICD-10-CM

## 2011-11-24 DIAGNOSIS — M179 Osteoarthritis of knee, unspecified: Secondary | ICD-10-CM | POA: Insufficient documentation

## 2011-11-24 DIAGNOSIS — Z1239 Encounter for other screening for malignant neoplasm of breast: Secondary | ICD-10-CM | POA: Insufficient documentation

## 2011-11-24 DIAGNOSIS — F5105 Insomnia due to other mental disorder: Secondary | ICD-10-CM | POA: Insufficient documentation

## 2011-11-24 MED ORDER — QUETIAPINE FUMARATE 50 MG PO TABS: 50.0000 mg | ORAL_TABLET | Freq: Two times a day (BID) | ORAL | Status: AC

## 2011-11-24 MED ORDER — PNEUMOCOCCAL VAC POLYVALENT 25 MCG/0.5ML IJ INJ: 0.5000 mL | INJECTION | Freq: Once | INTRAMUSCULAR | Status: DC

## 2011-11-24 MED ORDER — METFORMIN HCL 500 MG PO TABS: 500.0000 mg | ORAL_TABLET | Freq: Two times a day (BID) | ORAL | Status: DC

## 2011-11-24 MED ORDER — LISINOPRIL 5 MG PO TABS: 5.0000 mg | ORAL_TABLET | Freq: Every day | ORAL | Status: AC

## 2011-11-24 NOTE — Assessment & Plan Note (Addendum)
LDL is due.  She is not fasting , so an LDL is ordered.  She is taking a statin

## 2011-11-24 NOTE — Patient Instructions (Signed)
Your EKG was fine.  You do not have atrial fibrillation.   We are checking labs today to see how your blood sugars are doing.

## 2011-11-24 NOTE — Assessment & Plan Note (Signed)
Discussed the medication she is already using including trazodone Seroquel and soma.   her sleep habits are not  good. I suggested prior to adding any more medications that she stopped watching TV in bed and 21 hour prior to going to sleep. We'll increase her Seroquel dose at next visit if she still having trouble.

## 2011-11-24 NOTE — Progress Notes (Signed)
Subjective:    Patient ID: Margaret Salazar, female    DOB: May 01, 1932, 78 y.o.   MRN: [id number]  HPI  78 yo white female with history of DM, schizoaffective disorder and DJD presents for 3 month followup.  Had an episode of nausea and vomiting x 5 , two weeks, accompanied by dry cough, that has now resolved.  But the vomiting and coughing caused some chest pain which has resolved.  Appetite is  improving.  No history of diarrhea, actually took some MOM for constipation.  She is having trouble sleeping. Despite taking trazodone, seroquel and soma in the evening. She watches TV in bed for at least an hour every night and is requesting additional medication.  Past Medical History  Diagnosis Date  . Hypertension   . Hyperlipidemia   . Diabetes mellitus   . Anxiety   . Fracture of humerus   . Schizoaffective disorder, bipolar type   . Diverticulosis of colon march 2009    by screening colonoscopy  . Septic shock due to Escherichia coli 2004    secondary to pyelonephritis  . Degenerative joint disease of knee    Current Outpatient Prescriptions on File Prior to Visit  Medication Sig Dispense Refill  . carisoprodol (SOMA) 350 MG tablet TAKE 1 TABLET BY MOUTH THREE TIMES DAILY AS NEEDED FOR MUSCLE SPASMS AS DIRECTED  90 tablet  3  . clobetasol (TEMOVATE) 0.05 % cream       . pneumococcal 13-valent conjugate vaccine (PREVNAR 13) SUSP injection Inject 0.5 mLs into the muscle once.  0.5 mL  0  . risperiDONE (RISPERDAL) 0.5 MG tablet TAKE 1 TABLET BY MOUTH TWICE DAILY  180 tablet  0  . sertraline (ZOLOFT) 100 MG tablet TAKE 1 TABLET BY MOUTH EVERY DAY  30 tablet  3  . simvastatin (ZOCOR) 40 MG tablet Take 1 tablet (40 mg total) by mouth at bedtime.  90 tablet  0  . traMADol (ULTRAM) 50 MG tablet       . traZODone (DESYREL) 50 MG tablet TAKE 1 TO 2 TABLETS BY MOUTH EVERY NIGHT AT BEDTIME  60 tablet  3    .  Review of Systems  Constitutional: Negative for fever, chills and unexpected weight  change.  HENT: Negative for hearing loss, ear pain, nosebleeds, congestion, sore throat, facial swelling, rhinorrhea, sneezing, mouth sores, trouble swallowing, neck pain, neck stiffness, voice change, postnasal drip, sinus pressure, tinnitus and ear discharge.   Eyes: Negative for pain, discharge, redness and visual disturbance.  Respiratory: Negative for cough, chest tightness, shortness of breath, wheezing and stridor.   Cardiovascular: Negative for chest pain, palpitations and leg swelling.  Musculoskeletal: Negative for myalgias and arthralgias.  Skin: Negative for color change and rash.  Neurological: Negative for dizziness, weakness, light-headedness and headaches.  Hematological: Negative for adenopathy.  Psychiatric/Behavioral: Positive for sleep disturbance.   BP 108/64  Pulse 108  Temp(Src) 97.9 F (36.6 C) (Oral)  Wt 132 lb (59.875 kg)  SpO2 96%     Objective:   Physical Exam  Constitutional: She is oriented to person, place, and time. She appears well-developed and well-nourished.  HENT:  Mouth/Throat: Oropharynx is clear and moist.  Eyes: EOM are normal. Pupils are equal, round, and reactive to light. No scleral icterus.  Neck: Normal range of motion. Neck supple. No JVD present. No thyromegaly present.  Cardiovascular: Normal rate, regular rhythm, normal heart sounds and intact distal pulses.   Pulmonary/Chest: Effort normal and breath sounds normal.  Abdominal:  Soft. Bowel sounds are normal. She exhibits no mass. There is no tenderness.  Musculoskeletal: Normal range of motion. She exhibits no edema.  Lymphadenopathy:    She has no cervical adenopathy.  Neurological: She is alert and oriented to person, place, and time.  Skin: Skin is warm and dry.  Psychiatric: She has a normal mood and affect.      Assessment & Plan:   Screening for colon cancer screenign colonoscopy 2008 by Dr. Servando Snare,  Next due 2018.Marland Kitchen    Screening for breast cancer Done annually at  [facility],  Recent per patinetr  Was normal   Insomnia related to another mental disorder Discussed the medication she is already using including trazodone Seroquel and soma.   her sleep habits are not  good. I suggested prior to adding any more medications that she stopped watching TV in bed and 21 hour prior to going to sleep. We'll increase her Seroquel dose at next visit if she still having trouble.  Hyperlipidemia LDL goal < 70 LDL is due.  She is not fasting , so an LDL is ordered.  She is taking a statin   DIABETES MELLITUS, TYPE II She has not brought her log of  blood sugars with her and does not know what her sugars have been running. Hemoglobin A1c is due.     Updated Medication List Outpatient Encounter Prescriptions as of 11/24/2011  Medication Sig Dispense Refill  . carisoprodol (SOMA) 350 MG tablet TAKE 1 TABLET BY MOUTH THREE TIMES DAILY AS NEEDED FOR MUSCLE SPASMS AS DIRECTED  90 tablet  3  . clobetasol (TEMOVATE) 0.05 % cream       . lisinopril (PRINIVIL,ZESTRIL) 5 MG tablet Take 1 tablet (5 mg total) by mouth daily.  30 tablet  6  . metFORMIN (GLUCOPHAGE) 500 MG tablet Take 1 tablet (500 mg total) by mouth 2 (two) times daily with a meal.  60 tablet  6  . pneumococcal 13-valent conjugate vaccine (PREVNAR 13) SUSP injection Inject 0.5 mLs into the muscle once.  0.5 mL  0  . pneumococcal 23 valent vaccine (PNU-IMMUNE) 25 MCG/0.5ML injection Inject 0.5 mLs into the muscle once.  2.5 mL  0  . risperiDONE (RISPERDAL) 0.5 MG tablet TAKE 1 TABLET BY MOUTH TWICE DAILY  180 tablet  0  . sertraline (ZOLOFT) 100 MG tablet TAKE 1 TABLET BY MOUTH EVERY DAY  30 tablet  3  . simvastatin (ZOCOR) 40 MG tablet Take 1 tablet (40 mg total) by mouth at bedtime.  90 tablet  0  . traMADol (ULTRAM) 50 MG tablet       . traZODone (DESYREL) 50 MG tablet TAKE 1 TO 2 TABLETS BY MOUTH EVERY NIGHT AT BEDTIME  60 tablet  3  . DISCONTD: lisinopril (PRINIVIL,ZESTRIL) 5 MG tablet Take 1 tablet (5 mg total)  by mouth daily.  30 tablet  3  . DISCONTD: metFORMIN (GLUCOPHAGE) 500 MG tablet TAKE 1 TABLET BY MOUTH TWICE DAILY  60 tablet  3  . DISCONTD: pneumococcal 23 valent vaccine (PNU-IMMUNE) 25 MCG/0.5ML injection Inject 0.5 mLs into the muscle once.  2.5 mL  0  . DISCONTD: pneumococcal 23 valent vaccine (PNU-IMMUNE) 25 MCG/0.5ML injection Inject 0.5 mLs into the muscle once.  2.5 mL  0  . DISCONTD: pneumococcal 23 valent vaccine (PNU-IMMUNE) 25 MCG/0.5ML injection Inject 0.5 mLs into the muscle once.  2.5 mL  0  . DISCONTD: pneumococcal 23 valent vaccine (PNU-IMMUNE) 25 MCG/0.5ML injection Inject 0.5 mLs into the muscle  once.  2.5 mL  0  . QUEtiapine (SEROQUEL) 50 MG tablet Take 1 tablet (50 mg total) by mouth 2 (two) times daily.  60 tablet  6

## 2011-11-24 NOTE — Assessment & Plan Note (Signed)
screenign colonoscopy 2008 by Dr. Servando Snare,  Next due 2018.Marland Kitchen

## 2011-11-24 NOTE — Assessment & Plan Note (Signed)
Done annually at [facility],  Recent per patinetr  Was normal

## 2011-11-24 NOTE — Assessment & Plan Note (Signed)
She has not brought her log of  blood sugars with her and does not know what her sugars have been running. Hemoglobin A1c is due.

## 2011-11-25 LAB — COMPREHENSIVE METABOLIC PANEL
ALT: 15 U/L (ref 0–35)
AST: 17 U/L (ref 0–37)
Alkaline Phosphatase: 88 U/L (ref 39–117)
Chloride: 100 mEq/L (ref 96–112)
Creatinine, Ser: 0.8 mg/dL (ref 0.4–1.2)
Total Bilirubin: 0.3 mg/dL (ref 0.3–1.2)

## 2011-11-25 LAB — TSH: TSH: 0.69 u[IU]/mL (ref 0.35–5.50)

## 2011-11-25 LAB — HEMOGLOBIN A1C: Hgb A1c MFr Bld: 7.4 % — ABNORMAL HIGH (ref 4.6–6.5)

## 2011-11-26 MED ORDER — METFORMIN HCL 850 MG PO TABS: 850.0000 mg | ORAL_TABLET | Freq: Two times a day (BID) | ORAL | Status: DC

## 2011-11-26 NOTE — Progress Notes (Signed)
Addended by: Duncan Dull on: 11/26/2011 01:18 PM   Modules accepted: Orders

## 2011-12-04 ENCOUNTER — Other Ambulatory Visit: Payer: Self-pay | Admitting: Internal Medicine

## 2012-01-18 ENCOUNTER — Other Ambulatory Visit: Payer: Self-pay | Admitting: Internal Medicine

## 2012-01-22 ENCOUNTER — Other Ambulatory Visit: Payer: Self-pay | Admitting: Internal Medicine

## 2012-02-07 ENCOUNTER — Other Ambulatory Visit: Payer: Self-pay | Admitting: Internal Medicine

## 2012-02-16 ENCOUNTER — Encounter: Payer: Self-pay | Admitting: Internal Medicine

## 2012-02-16 ENCOUNTER — Ambulatory Visit (INDEPENDENT_AMBULATORY_CARE_PROVIDER_SITE_OTHER): Payer: 59 | Admitting: Internal Medicine

## 2012-02-16 VITALS — BP 112/66 | HR 110 | Temp 98.4°F | Resp 14 | Wt 122.5 lb

## 2012-02-16 DIAGNOSIS — Z9889 Other specified postprocedural states: Secondary | ICD-10-CM

## 2012-02-16 DIAGNOSIS — E119 Type 2 diabetes mellitus without complications: Secondary | ICD-10-CM

## 2012-02-16 DIAGNOSIS — R112 Nausea with vomiting, unspecified: Secondary | ICD-10-CM | POA: Insufficient documentation

## 2012-02-16 LAB — POCT URINALYSIS DIPSTICK
Glucose, UA: NEGATIVE
Leukocytes, UA: NEGATIVE
Nitrite, UA: NEGATIVE
Urobilinogen, UA: 1

## 2012-02-16 NOTE — Progress Notes (Signed)
Patient ID: Margaret Salazar, female   DOB: 01/16/1932, 78 y.o.   MRN: [id number]   Patient Active Problem List  Diagnoses  . DIABETES MELLITUS, TYPE II  . ANXIETY  . AGITATION  . Hx of colonoscopy  . Schizoaffective disorder, bipolar type  . Diverticulosis of colon  . Degenerative joint disease of knee  . Hyperlipidemia LDL goal < 70  . Screening for colon cancer  . Screening for breast cancer  . Insomnia related to another mental disorder  . Nausea and vomiting in adult    Subjective:  CC:   Chief Complaint  Patient presents with  . Fatigue  . Weight Loss    HPI:   Margaret Salazar a 78 y.o. female who presents with new onset Weight loss of 10 lbs due to recurrent vomiting, whiich has occurred in the morning and once in the middle of night with no warning.  She denies diarrhea, feels better immediately. Appetite has been poor,  Denies abdominal pain.  Has had 3 falls .  First episodes occurred after getting up in the middle of the night due to being chilled.  Fell in the bedroom.  2nd time occurred last week, got and was immediately dizzy .  Husband feels she is overmedicated. See Dr. Achilles Dunk last week and did not have a UTI .     Past Medical History  Diagnosis Date  . Hypertension   . Hyperlipidemia   . Diabetes mellitus   . Anxiety   . Fracture of humerus   . Schizoaffective disorder, bipolar type   . Diverticulosis of colon march 2009    by screening colonoscopy  . Septic shock due to Escherichia coli 2004    secondary to pyelonephritis  . Degenerative joint disease of knee     Past Surgical History  Procedure Date  . Cholecystectomy   . Appendectomy          The following portions of the patient's history were reviewed and updated as appropriate: Allergies, current medications, and problem list.    Review of Systems:   12 Pt  review of systems was negative except those addressed in the HPI,     History   Social History  . Marital Status:  Married    Spouse Name: N/A    Number of Children: N/A  . Years of Education: N/A   Occupational History  . Not on file.   Social History Main Topics  . Smoking status: Never Smoker   . Smokeless tobacco: Never Used  . Alcohol Use: No  . Drug Use: No  . Sexually Active: Not on file   Other Topics Concern  . Not on file   Social History Narrative  . No narrative on file    Objective:  BP 112/66  Pulse 110  Temp(Src) 98.4 F (36.9 C) (Oral)  Resp 14  Wt 122 lb 8 oz (55.566 kg)  SpO2 96%  General appearance: alert, cooperative and appears stated age Ears: normal TM's and external ear canals both ears Throat: lips, mucosa, and tongue normal; teeth and gums normal Neck: no adenopathy, no carotid bruit, supple, symmetrical, trachea midline and thyroid not enlarged, symmetric, no tenderness/mass/nodules Back: symmetric, no curvature. ROM normal. No CVA tenderness. Lungs: clear to auscultation bilaterally Heart: regular rate and rhythm, S1, S2 normal, no murmur, click, rub or gallop Abdomen: soft, non-tender; bowel sounds normal; no masses,  no organomegaly Pulses: 2+ and symmetric Skin: Skin color, texture, turgor normal. No rashes or  lesions Lymph nodes: Cervical, supraclavicular, and axillary nodes normal.  Assessment and Plan:  Hx of colonoscopy Done by Rickey Primus 2009,  Diverticulosis found,.      Nausea and vomiting in adult Ordering CMEt, UA and abdominal ultrasound. Consider gastroparesis given history of DM. Reducing her medicatiions as she is onn several thaty may be contributing.     Updated Medication List Outpatient Encounter Prescriptions as of 02/16/2012  Medication Sig Dispense Refill  . clobetasol (TEMOVATE) 0.05 % cream       . lisinopril (PRINIVIL,ZESTRIL) 5 MG tablet Take 1 tablet (5 mg total) by mouth daily.  30 tablet  6  . nitrofurantoin (MACRODANTIN) 100 MG capsule Take 100 mg by mouth daily.      . QUEtiapine (SEROQUEL) 50 MG tablet Take  50 mg by mouth 2 (two) times daily.      . sertraline (ZOLOFT) 100 MG tablet TAKE 1 TABLET BY MOUTH EVERY DAY  30 tablet  3  . simvastatin (ZOCOR) 40 MG tablet TAKE ONE TABLET BY MOUTH EVERY NIGHT AT BEDTIME  90 tablet  0  . traMADol (ULTRAM) 50 MG tablet       . traZODone (DESYREL) 50 MG tablet TAKE 1 TO 2 TABLETS BY MOUTH EVERY NIGHT AT BEDTIME  60 tablet  3  . DISCONTD: carisoprodol (SOMA) 350 MG tablet TAKE 1 TABLET BY MOUTH THREE TIMES DAILY AS NEEDED FOR MUSCLE SPASMS  90 tablet  0  . DISCONTD: metFORMIN (GLUCOPHAGE) 850 MG tablet Take 1 tablet (850 mg total) by mouth 2 (two) times daily with a meal.  60 tablet  6  . DISCONTD: risperiDONE (RISPERDAL) 0.5 MG tablet TAKE 1 TABLET BY MOUTH TWICE DAILY  180 tablet  3  . DISCONTD: pneumococcal 13-valent conjugate vaccine (PREVNAR 13) SUSP injection Inject 0.5 mLs into the muscle once.  0.5 mL  0  . DISCONTD: pneumococcal 23 valent vaccine (PNU-IMMUNE) 25 MCG/0.5ML injection Inject 0.5 mLs into the muscle once.  2.5 mL  0     Orders Placed This Encounter  Procedures  . US Abdomen Complete  . HM MAMMOGRAPHY  . COMPLETE METABOLIC PANEL WITH GFR  . Hemoglobin A1c  . Microalbumin / creatinine urine ratio  . HM PAP SMEAR  . POCT Urinalysis Dipstick  . HM COLONOSCOPY    Return in about 1 week (around 02/23/2012).

## 2012-02-16 NOTE — Patient Instructions (Signed)
I want to reduce the soma to twice daily for one week, then once daily thereafter  I want to stop the risperidone.    Increase the seroquel to twice daily   suspend the metformin for now because it causes GI problems.  We may start glipizide if necessary once I see your bloodwork.

## 2012-02-17 LAB — COMPLETE METABOLIC PANEL WITH GFR
ALT: 17 U/L (ref 0–35)
AST: 14 U/L (ref 0–37)
Albumin: 3.2 g/dL — ABNORMAL LOW (ref 3.5–5.2)
Alkaline Phosphatase: 244 U/L — ABNORMAL HIGH (ref 39–117)
Potassium: 4.3 mEq/L (ref 3.5–5.3)
Sodium: 129 mEq/L — ABNORMAL LOW (ref 135–145)
Total Bilirubin: 0.3 mg/dL (ref 0.3–1.2)
Total Protein: 6.8 g/dL (ref 6.0–8.3)

## 2012-02-17 LAB — MICROALBUMIN / CREATININE URINE RATIO
Creatinine,U: 171.5 mg/dL
Microalb Creat Ratio: 2.4 mg/g (ref 0.0–30.0)

## 2012-02-18 ENCOUNTER — Encounter: Payer: Self-pay | Admitting: Internal Medicine

## 2012-02-18 NOTE — Assessment & Plan Note (Signed)
Done by Rickey Primus 2009,  Diverticulosis found,.

## 2012-02-18 NOTE — Assessment & Plan Note (Addendum)
Ordering CMEt, UA and abdominal ultrasound. Consider gastroparesis given history of DM. Reducing her medicatiions as she is onn several thaty may be contributing.

## 2012-02-20 ENCOUNTER — Ambulatory Visit: Payer: Self-pay | Admitting: Internal Medicine

## 2012-02-22 ENCOUNTER — Other Ambulatory Visit: Payer: Self-pay | Admitting: Internal Medicine

## 2012-02-22 DIAGNOSIS — M549 Dorsalgia, unspecified: Secondary | ICD-10-CM

## 2012-02-23 ENCOUNTER — Ambulatory Visit (INDEPENDENT_AMBULATORY_CARE_PROVIDER_SITE_OTHER): Payer: 59 | Admitting: Internal Medicine

## 2012-02-23 ENCOUNTER — Encounter: Payer: Self-pay | Admitting: Internal Medicine

## 2012-02-23 ENCOUNTER — Telehealth: Payer: Self-pay | Admitting: Internal Medicine

## 2012-02-23 VITALS — BP 118/70 | HR 97 | Temp 97.7°F | Resp 14 | Wt 120.5 lb

## 2012-02-23 DIAGNOSIS — R109 Unspecified abdominal pain: Secondary | ICD-10-CM | POA: Insufficient documentation

## 2012-02-23 DIAGNOSIS — R935 Abnormal findings on diagnostic imaging of other abdominal regions, including retroperitoneum: Secondary | ICD-10-CM

## 2012-02-23 DIAGNOSIS — R42 Dizziness and giddiness: Secondary | ICD-10-CM | POA: Insufficient documentation

## 2012-02-23 MED ORDER — MIDODRINE HCL 2.5 MG PO TABS: 2.5000 mg | ORAL_TABLET | Freq: Three times a day (TID) | ORAL | Status: AC

## 2012-02-23 MED ORDER — HYDROCODONE-ACETAMINOPHEN 10-325 MG PO TABS: 1.0000 | ORAL_TABLET | Freq: Two times a day (BID) | ORAL | Status: AC | PRN

## 2012-02-23 NOTE — Assessment & Plan Note (Signed)
Given her weight loss and absence of fevers, abdominal pain with nausea and I am concerned about metastatic disease from an unknown primary. This was raised as a possibility for the lesions as seen in her liver. I have ordered abdomen and pelvic CT and try to get a CT of the the lungs approved as well. I discussed the findings of and abdominal ultrasound with the patient and her husband and have advised him that a liver biopsy is in the near future.

## 2012-02-23 NOTE — Assessment & Plan Note (Signed)
She has positive orhtostatics today with drop in systolic from 120 to 100.  She is well hydrated today. I suspect that her orthostatic states this is due to autonomic dysfunction from diabetes. We'll start low-dose to mitigate symptoms

## 2012-02-23 NOTE — Assessment & Plan Note (Signed)
Her abdominal pain is bilateral and left the left and right upper quadrant affected and it is positional I suspect may be related to thoracic vertebral degenerative disease. 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 She has no relief of pain with tramadol. She is requesting Vicodin since she has recently tried her husband's Vicodin and this would lead to her pain. I have spoken with her and her husband about the inappropriate sharing of medications and warned them that this will not be tolerated again.

## 2012-02-23 NOTE — Patient Instructions (Addendum)
We are starting a medicine to keep your  blood pressure from dropping when  you stands up because this is causing you to feel dizzy  We are ordering a CT scan of your chest and abdomen to get more information about the spots on your liver  I am giving you your own prescription for vicodin.    You may not refill it early or share it with other people;  This is not negotiable.

## 2012-02-23 NOTE — Progress Notes (Signed)
Patient ID: Margaret Salazar, female   DOB: Mar 09, 1932, 78 y.o.   MRN: [id number]   Patient Active Problem List  Diagnoses  . DIABETES MELLITUS, TYPE II  . ANXIETY  . AGITATION  . Hx of colonoscopy  . Schizoaffective disorder, bipolar type  . Diverticulosis of colon  . Degenerative joint disease of knee  . Hyperlipidemia LDL goal < 70  . Screening for colon cancer  . Screening for breast cancer  . Insomnia related to another mental disorder  . Nausea and vomiting in adult  . Dizziness - light-headed  . Abdominal pain of unknown etiology  . Abnormal abdominal ultrasound    Subjective:  CC:   Chief Complaint  Patient presents with  . Follow-up    HPI:   Margaret Salazar a 78 y.o. female who presents For followup on recent episodes of nausea and vomiting accompanied by loss of balance and falls .her nausea and vomiting have resolved but she remains very dizzy with sudden position changes. She has had no subsequent falls in one week. She does report bilateral abdominal pain which is brought on by lying flat on her back. She states that her appetite is improved she has lost another 3 pounds.  Her abdominal ultrasound was abnormal and there were multiple hypoechoic spots on her liver and along with several small spots on her pancreas concerning for metastatic disease .  Marland Kitchen      Past Medical History  Diagnosis Date  . Hypertension   . Hyperlipidemia   . Diabetes mellitus   . Anxiety   . Fracture of humerus   . Schizoaffective disorder, bipolar type   . Diverticulosis of colon march 2009    by screening colonoscopy  . Septic shock due to Escherichia coli 2004    secondary to pyelonephritis  . Degenerative joint disease of knee     Past Surgical History  Procedure Date  . Cholecystectomy   . Appendectomy          The following portions of the patient's history were reviewed and updated as appropriate: Allergies, current medications, and problem list.    Review of  Systems:   12 Pt  review of systems was negative except those addressed in the HPI,     History   Social History  . Marital Status: Married    Spouse Name: N/A    Number of Children: N/A  . Years of Education: N/A   Occupational History  . Not on file.   Social History Main Topics  . Smoking status: Never Smoker   . Smokeless tobacco: Never Used  . Alcohol Use: No  . Drug Use: No  . Sexually Active: Not on file   Other Topics Concern  . Not on file   Social History Narrative  . No narrative on file    Objective:  BP 118/70  Pulse 97  Temp(Src) 97.7 F (36.5 C) (Oral)  Resp 14  Wt 120 lb 8 oz (54.658 kg)  SpO2 97%  General appearance: alert, cooperative and appears stated age Ears: normal TM's and external ear canals both ears Throat: lips, mucosa, and tongue normal; teeth and gums normal Neck: no adenopathy, no carotid bruit, supple, symmetrical, trachea midline and thyroid not enlarged, symmetric, no tenderness/mass/nodules Back: symmetric, no curvature. ROM normal. No CVA tenderness. Lungs: clear to auscultation bilaterally Heart: regular rate and rhythm, S1, S2 normal, no murmur, click, rub or gallop Abdomen: soft, non-tender; bowel sounds normal; no masses,  no organomegaly  Pulses: 2+ and symmetric Skin: Skin color, texture, turgor normal. No rashes or lesions Lymph nodes: Cervical, supraclavicular, and axillary nodes normal.  Assessment and Plan:  Dizziness - light-headed She has positive orhtostatics today with drop in systolic from 120 to 100.  She is well hydrated today. I suspect that her orthostatic states this is due to autonomic dysfunction from diabetes. We'll start low-dose to mitigate symptoms   Abdominal pain of unknown etiology Her abdominal pain is bilateral and left the left and right upper quadrant affected and it is positional I suspect may be related to thoracic vertebral degenerative disease. When trying to move her off of soma which  he has been addicted to for years. She has no relief of pain with tramadol. She is requesting Vicodin since she has recently tried her husband's Vicodin and this would lead to her pain. I have spoken with her and her husband about the inappropriate sharing of medications and warned them that this will not be tolerated again.  Abnormal abdominal ultrasound Given her weight loss and absence of fevers, abdominal pain with nausea and I am concerned about metastatic disease from an unknown primary. This was raised as a possibility for the lesions as seen in her liver. I have ordered abdomen and pelvic CT and try to get a CT of the the lungs approved as well. I discussed the findings of and abdominal ultrasound with the patient and her husband and have advised him that a liver biopsy is in the near future.    Updated Medication List Outpatient Encounter Prescriptions as of 02/23/2012  Medication Sig Dispense Refill  . carisoprodol (SOMA) 350 MG tablet Take 1 tablet (350 mg total) by mouth 2 (two) times daily as needed for muscle spasms.  60 tablet  0  . lisinopril (PRINIVIL,ZESTRIL) 5 MG tablet Take 1 tablet (5 mg total) by mouth daily.  30 tablet  6  . QUEtiapine (SEROQUEL) 50 MG tablet Take 50 mg by mouth 2 (two) times daily.      . sertraline (ZOLOFT) 100 MG tablet TAKE 1 TABLET BY MOUTH EVERY DAY  30 tablet  3  . simvastatin (ZOCOR) 40 MG tablet TAKE ONE TABLET BY MOUTH EVERY NIGHT AT BEDTIME  90 tablet  0  . traZODone (DESYREL) 50 MG tablet TAKE 1 TO 2 TABLETS BY MOUTH EVERY NIGHT AT BEDTIME  60 tablet  3  . DISCONTD: traMADol (ULTRAM) 50 MG tablet       . HYDROcodone-acetaminophen (NORCO) 10-325 MG per tablet Take 1 tablet by mouth 2 (two) times daily as needed for pain.  60 tablet  2  . midodrine (PROAMATINE) 2.5 MG tablet Take 1 tablet (2.5 mg total) by mouth 3 (three) times daily.  90 tablet  1  . QUEtiapine (SEROQUEL) 50 MG tablet Take 1 tablet (50 mg total) by mouth 2 (two) times daily.  60  tablet  6  . DISCONTD: clobetasol (TEMOVATE) 0.05 % cream       . DISCONTD: nitrofurantoin (MACRODANTIN) 100 MG capsule Take 100 mg by mouth daily.         No orders of the defined types were placed in this encounter.    No Follow-up on file.

## 2012-02-23 NOTE — Telephone Encounter (Signed)
I spoke with Lupita Leash at [facility], she gave me prior [place]. For the CT Abdomen and Pelvis which is [phone] expires 04/08/12.  She documented different things on the CT of the chest, but wasn't able to give me Prior-Auth. for it.  It has went to physican review and we should hear back from them within 2 business days.  If you would like to add any information you can call them at [phone] the case number is [id number].

## 2012-02-25 ENCOUNTER — Telehealth: Payer: Self-pay | Admitting: Internal Medicine

## 2012-02-25 NOTE — Telephone Encounter (Signed)
[phone]  Or [phone] Pt spouse  Thurmond Butts called to see if the ct chest and abd.  Has been approved by ins co Pt stated we were going to call insurance co to get approval

## 2012-02-26 ENCOUNTER — Telehealth: Payer: Self-pay | Admitting: Internal Medicine

## 2012-02-26 ENCOUNTER — Encounter: Payer: Self-pay | Admitting: Internal Medicine

## 2012-02-26 NOTE — Telephone Encounter (Addendum)
Patient is aware of the appointment at [facility] location on 4.26.13 @ 10:30 arrival 10:15 no solid foods 4 hours prior to appointment ,may have clear liquids until appointment time, bring list of medications and pick up prep kit. I will forward list of medications with order and lab work. CT authorization [id number].

## 2012-02-27 ENCOUNTER — Ambulatory Visit: Payer: Self-pay | Admitting: Internal Medicine

## 2012-03-01 ENCOUNTER — Telehealth: Payer: Self-pay | Admitting: Internal Medicine

## 2012-03-01 NOTE — Telephone Encounter (Signed)
Patient advised that we have not seen the results. Advised her that we will call her once we have the results in.

## 2012-03-01 NOTE — Telephone Encounter (Signed)
Pt spouse wade called wanted to know if the results were back from the ct Saturday am [phone]

## 2012-03-02 ENCOUNTER — Ambulatory Visit (INDEPENDENT_AMBULATORY_CARE_PROVIDER_SITE_OTHER): Payer: 59 | Admitting: Internal Medicine

## 2012-03-02 ENCOUNTER — Telehealth: Payer: Self-pay | Admitting: Internal Medicine

## 2012-03-02 ENCOUNTER — Encounter: Payer: Self-pay | Admitting: Internal Medicine

## 2012-03-02 DIAGNOSIS — R933 Abnormal findings on diagnostic imaging of other parts of digestive tract: Secondary | ICD-10-CM | POA: Insufficient documentation

## 2012-03-02 NOTE — Telephone Encounter (Signed)
Left another message with son asking patient to return my call.

## 2012-03-02 NOTE — Progress Notes (Signed)
Patient ID: Margaret Salazar, female   DOB: 1932/02/06, 78 y.o.   MRN: [id number]  Patient Active Problem List  Diagnoses  . DIABETES MELLITUS, TYPE II  . ANXIETY  . AGITATION  . Hx of colonoscopy  . Schizoaffective disorder, bipolar type  . Diverticulosis of colon  . Degenerative joint disease of knee  . Hyperlipidemia LDL goal < 70  . Screening for colon cancer  . Screening for breast cancer  . Insomnia related to another mental disorder  . Nausea and vomiting in adult  . Dizziness - light-headed  . Abdominal pain of unknown etiology  . Abnormal abdominal ultrasound  . melanoma  . Abnormal CT scan, gastrointestinal tract    Subjective:  CC:   No chief complaint on file.   HPI:   Margaret Salazar a 78 y.o. female who presents for follow up on nausea, vomiting, weight loss and dizziness which started about 4 weeks ago.  She had an abnormal abdominal ultrasound which was concerning for metastatic disease and returns today to discuss results of abdominal/pelvic CT done April 26 at [facility].  Patient's dizziness and weight loss have improved, but her appetite remains poor and she has early satiety.   Bowel are moving normally.  Denies abdominal pain   Past Medical History  Diagnosis Date  . Hypertension   . Hyperlipidemia   . Diabetes mellitus   . Anxiety   . Fracture of humerus   . Schizoaffective disorder, bipolar type   . Diverticulosis of colon march 2009    by screening colonoscopy  . Septic shock due to Escherichia coli 2004    secondary to pyelonephritis  . Degenerative joint disease of knee   . melanoma May 2011    left shoulder, excised negative sentinel nodes    Past Surgical History  Procedure Date  . Cholecystectomy   . Appendectomy          The following portions of the patient's history were reviewed and updated as appropriate: Allergies, current medications, and problem list.    Review of Systems:   12 Pt  review of systems was negative  except those addressed in the HPI,     History   Social History  . Marital Status: Married    Spouse Name: N/A    Number of Children: N/A  . Years of Education: N/A   Occupational History  . Not on file.   Social History Main Topics  . Smoking status: Never Smoker   . Smokeless tobacco: Never Used  . Alcohol Use: No  . Drug Use: No  . Sexually Active: Not on file   Other Topics Concern  . Not on file   Social History Narrative  . No narrative on file    Objective:  BP 108/70  Pulse 102  Temp(Src) 98.2 F (36.8 C) (Oral)  Resp 16  Wt 120 lb 4 oz (54.545 kg)  SpO2 95%  General appearance: alert, cooperative and appears stated age Ears: normal TM's and external ear canals both ears Throat: lips, mucosa, and tongue normal; teeth and gums normal Neck: no adenopathy, no carotid bruit, supple, symmetrical, trachea midline and thyroid not enlarged, symmetric, no tenderness/mass/nodules Back: symmetric, no curvature. ROM normal. No CVA tenderness. Lungs: clear to auscultation bilaterally Heart: regular rate and rhythm, S1, S2 normal, no murmur, click, rub or gallop Abdomen: soft, non-tender; bowel sounds normal; no masses,  no organomegaly Pulses: 2+ and symmetric Skin: Skin color, texture, turgor normal. No rashes or lesions  Lymph nodes: Cervical, supraclavicular, and axillary nodes normal.  Assessment and Plan:  Abnormal CT scan, gastrointestinal tract Patient has evidence of metastatic disease involving pancreas, liver, colon and lung. She has no history of tobacco use, and is up to date on all screenings.   I have discussed this with patient and husband Thurmond Butts today along with my concern that that the primary is melanoma given her history of left shoulder melanoma resected May 2011.  I have scheduled her ot see Dr. Lemar Livings and Dr. Sherrlyn Hock tomorrow.      Updated Medication List Outpatient Encounter Prescriptions as of 03/02/2012  Medication Sig Dispense Refill  .  carisoprodol (SOMA) 350 MG tablet Take 1 tablet (350 mg total) by mouth 2 (two) times daily as needed for muscle spasms.  60 tablet  0  . HYDROcodone-acetaminophen (NORCO) 10-325 MG per tablet Take 1 tablet by mouth 2 (two) times daily as needed for pain.  60 tablet  2  . lisinopril (PRINIVIL,ZESTRIL) 5 MG tablet Take 1 tablet (5 mg total) by mouth daily.  30 tablet  6  . midodrine (PROAMATINE) 2.5 MG tablet Take 1 tablet (2.5 mg total) by mouth 3 (three) times daily.  90 tablet  1  . QUEtiapine (SEROQUEL) 50 MG tablet Take 50 mg by mouth 2 (two) times daily.      . sertraline (ZOLOFT) 100 MG tablet TAKE 1 TABLET BY MOUTH EVERY DAY  30 tablet  3  . simvastatin (ZOCOR) 40 MG tablet TAKE ONE TABLET BY MOUTH EVERY NIGHT AT BEDTIME  90 tablet  0  . traZODone (DESYREL) 50 MG tablet TAKE 1 TO 2 TABLETS BY MOUTH EVERY NIGHT AT BEDTIME  60 tablet  3  . QUEtiapine (SEROQUEL) 50 MG tablet Take 1 tablet (50 mg total) by mouth 2 (two) times daily.  60 tablet  6     No orders of the defined types were placed in this encounter.    No Follow-up on file.       Marland Kitchen

## 2012-03-02 NOTE — Patient Instructions (Addendum)
Your CT scan is concerning for a possible malignancy.  You have an appt with Dr. Lemar Livings tomorrow morning at 9:15 AM to discuss repeating your colonoscopy.  His office number is  [phone].    Address is  [address]  [address]      Dr. Sherrlyn Hock at the cancer center will see you tomorrow at 1:15 to help coordinate the other tests that need to be run to figure out what is going on in your lungs and your pancreas .  Try Ensure,  Glucerna,  Boost or Carnation Instant Breakfast drink (mix with milk) to get  more protein and calories into Margaret Salazar so she doesn't lose any more weight

## 2012-03-02 NOTE — Telephone Encounter (Signed)
Spoke with patient she will come to the appointment @ 4:00.

## 2012-03-02 NOTE — Assessment & Plan Note (Addendum)
Patient has evidence of metastatic disease involving pancreas, liver, colon and lung. She has no history of tobacco use, and is up to date on all screenings.   I have discussed this with patient and husband Margaret Salazar today along with my concern that that the primary is melanoma given her history of left shoulder melanoma resected May 2011.  I have scheduled her ot see Dr. Lemar Livings and Dr. Sherrlyn Hock tomorrow.

## 2012-03-02 NOTE — Telephone Encounter (Signed)
I scheduled patient for the appt and left a message with her son to give me a call back.

## 2012-03-02 NOTE — Telephone Encounter (Signed)
Please ask Margaret Salazar to come in for the 4;15 slot to discuss her ct results

## 2012-03-03 ENCOUNTER — Ambulatory Visit: Payer: Self-pay | Admitting: Internal Medicine

## 2012-03-03 ENCOUNTER — Encounter: Payer: Self-pay | Admitting: Internal Medicine

## 2012-03-03 LAB — PROTIME-INR: INR: 1

## 2012-03-04 ENCOUNTER — Telehealth: Payer: Self-pay | Admitting: Internal Medicine

## 2012-03-04 LAB — CEA: CEA: 240.5 ng/mL — ABNORMAL HIGH (ref 0.0–4.7)

## 2012-03-04 NOTE — Telephone Encounter (Signed)
[phone] Margaret Salazar came in today wanted to get an rx for [place] for pain. He requested oxycotin 10 to 15 mg She is hurting lower right abd. Dr Shara Blazing is colonoscopy wed and a biospy . They are starting her radation after wed  She has stage 4 cancer They are going to reduce cancer size to keep her comfortable

## 2012-03-05 MED ORDER — OXYCODONE-ACETAMINOPHEN 10-325 MG PO TABS: 1.0000 | ORAL_TABLET | ORAL | Status: AC | PRN

## 2012-03-05 NOTE — Telephone Encounter (Signed)
[name] called to check on her pain meds  Please call  [phone] [name] wants an answer as soon as possible

## 2012-03-05 NOTE — Telephone Encounter (Signed)
rx on printer,  He will need to pick up,  i need to sign

## 2012-03-05 NOTE — Telephone Encounter (Signed)
Called Margaret Salazar to let him know rx is ready to be picked up

## 2012-03-10 ENCOUNTER — Other Ambulatory Visit: Payer: Self-pay | Admitting: Internal Medicine

## 2012-03-10 ENCOUNTER — Ambulatory Visit: Payer: Self-pay | Admitting: General Surgery

## 2012-03-10 MED ORDER — METOPROLOL TARTRATE 25 MG PO TABS: 12.5000 mg | ORAL_TABLET | Freq: Two times a day (BID) | ORAL | Status: AC

## 2012-03-17 LAB — CBC CANCER CENTER
Bands: 25 %
Eosinophil: 16 %
HCT: 29.9 % — ABNORMAL LOW (ref 35.0–47.0)
HGB: 9.3 g/dL — ABNORMAL LOW (ref 12.0–16.0)
MCH: 26.1 pg (ref 26.0–34.0)
Metamyelocyte: 3 %
Myelocyte: 2 %
Platelet: 611 x10 3/mm — ABNORMAL HIGH (ref 150–440)
Promyelocyte: 1 %
RBC: 3.57 10*6/uL — ABNORMAL LOW (ref 3.80–5.20)
RDW: 15.5 % — ABNORMAL HIGH (ref 11.5–14.5)
Variant Lymphocyte: 3 %

## 2012-03-17 LAB — HEPATIC FUNCTION PANEL A (ARMC)
Albumin: 2 g/dL — ABNORMAL LOW (ref 3.4–5.0)
Bilirubin,Total: 0.3 mg/dL (ref 0.2–1.0)
SGOT(AST): 19 U/L (ref 15–37)

## 2012-03-17 LAB — BASIC METABOLIC PANEL
Calcium, Total: 8.5 mg/dL (ref 8.5–10.1)
Chloride: 95 mmol/L — ABNORMAL LOW (ref 98–107)
EGFR (Non-African Amer.): >60
Glucose: 313 mg/dL — ABNORMAL HIGH (ref 65–99)
Potassium: 4.4 mmol/L (ref 3.5–5.1)
Sodium: 131 mmol/L — ABNORMAL LOW (ref 136–145)

## 2012-03-19 DIAGNOSIS — R000 Tachycardia, unspecified: Secondary | ICD-10-CM

## 2012-03-19 LAB — CBC CANCER CENTER
Bands: 16 %
Eosinophil: 26 %
HCT: 29.2 % — ABNORMAL LOW (ref 35.0–47.0)
HGB: 9 g/dL — ABNORMAL LOW (ref 12.0–16.0)
MCHC: 30.9 g/dL — ABNORMAL LOW (ref 32.0–36.0)
MCV: 84 fL (ref 80–100)
Monocytes: 6 %
Platelet: 541 x10 3/mm — ABNORMAL HIGH (ref 150–440)
RBC: 3.49 10*6/uL — ABNORMAL LOW (ref 3.80–5.20)
Segmented Neutrophils: 44 %
WBC: 42.7 x10 3/mm — ABNORMAL HIGH (ref 3.6–11.0)

## 2012-03-19 LAB — BASIC METABOLIC PANEL
BUN: 21 mg/dL — ABNORMAL HIGH (ref 7–18)
Calcium, Total: 8.6 mg/dL (ref 8.5–10.1)
Co2: 24 mmol/L (ref 21–32)
EGFR (African American): >60
Osmolality: 285 (ref 275–301)
Sodium: 134 mmol/L — ABNORMAL LOW (ref 136–145)

## 2012-03-22 ENCOUNTER — Other Ambulatory Visit: Payer: Self-pay | Admitting: Internal Medicine

## 2012-03-22 NOTE — Telephone Encounter (Signed)
Medication phoned to [facility] pharmacy as instructed.Patient's husband notified as instructed by telephone.

## 2012-03-22 NOTE — Telephone Encounter (Signed)
Request for Ernst Breach refill 04.21.13 [id number] Please advise.

## 2012-03-23 LAB — CULTURE, BLOOD (SINGLE)

## 2012-03-25 ENCOUNTER — Ambulatory Visit: Payer: 59 | Admitting: Cardiovascular Disease

## 2012-03-31 ENCOUNTER — Encounter: Payer: Self-pay | Admitting: Internal Medicine

## 2012-04-03 ENCOUNTER — Ambulatory Visit: Payer: Self-pay | Admitting: Internal Medicine

## 2012-04-03 DEATH — deceased

## 2012-05-24 ENCOUNTER — Ambulatory Visit: Payer: 59 | Admitting: Internal Medicine

## 2013-05-09 IMAGING — US ABDOMEN ULTRASOUND
1 series · 16 of 25 positions shown · non-contrast
Comparison: none

REASON FOR EXAM: recurrent vomiting weight loss prior cholesterol
elevated ALK Phosphatase
COMMENTS:

[Series 1: abdomen ultrasound · 16 of 114 slices shown]
[im 1/114]
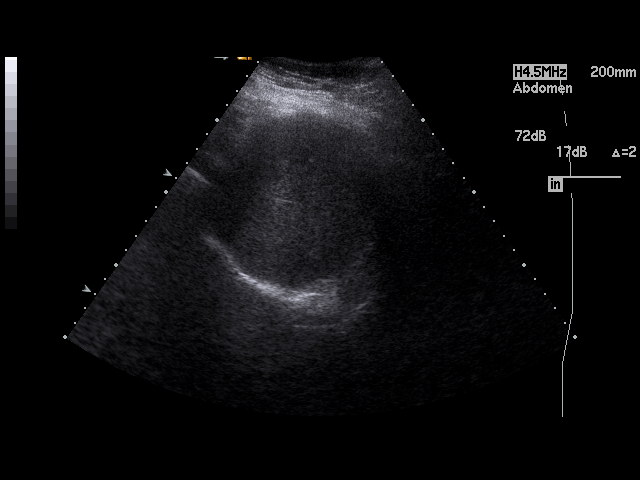
[im 10/114]
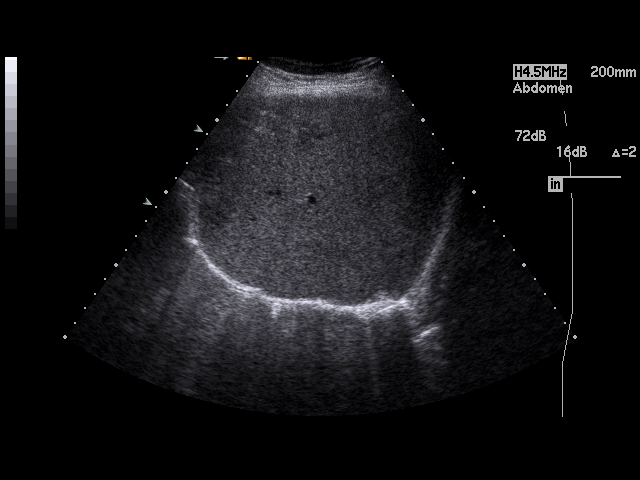
[im 15/114]
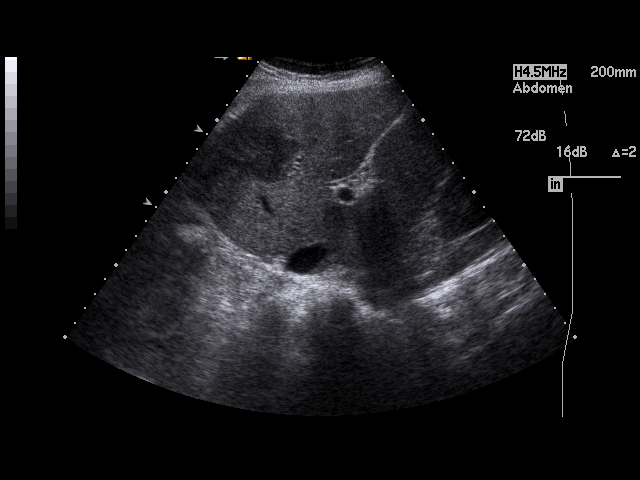
[im 24/114]
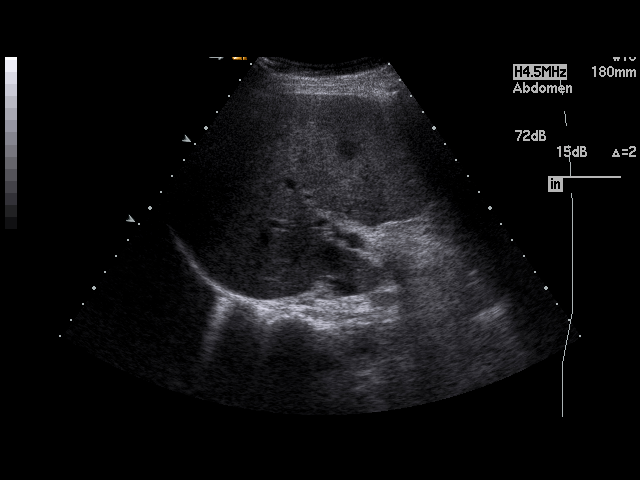
[im 33/114]
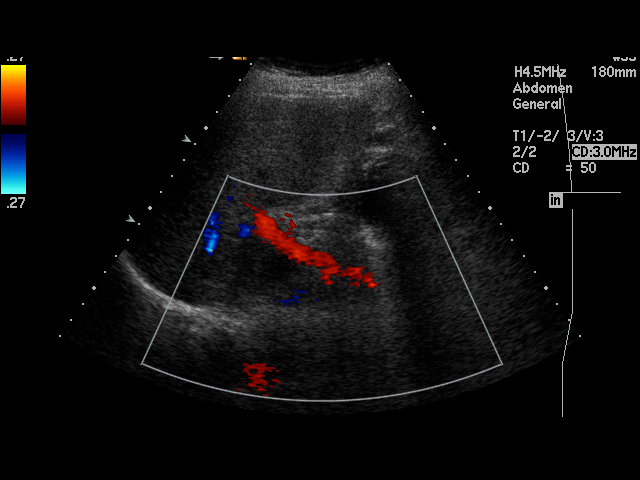
[im 38/114]
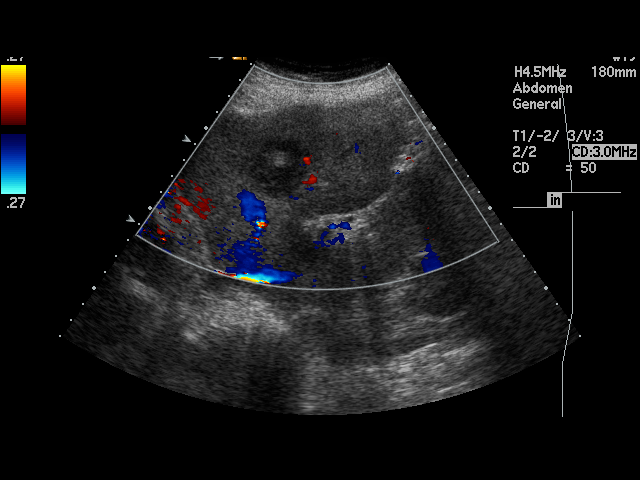
[im 48/114]
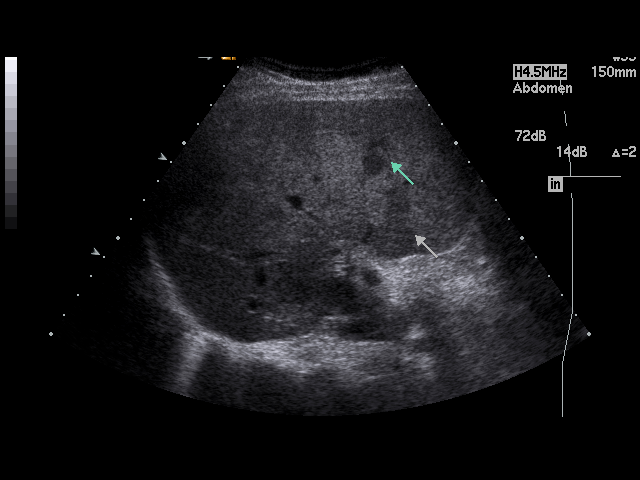
[im 52/114]
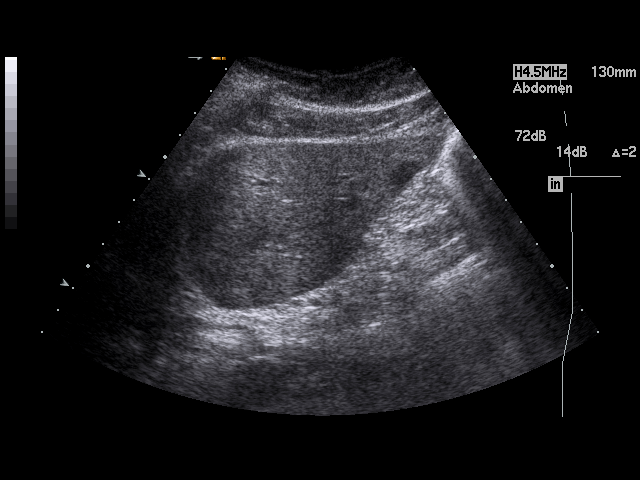
[im 62/114]
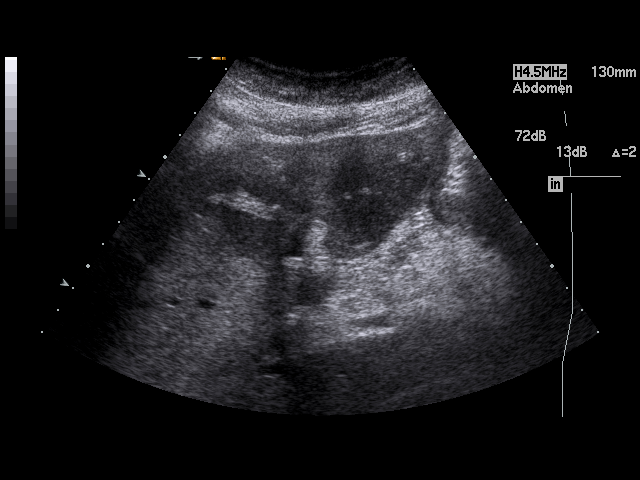
[im 66/114]
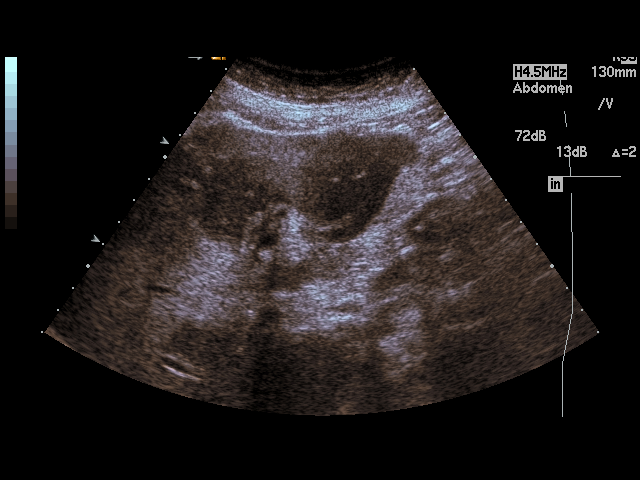
[im 76/114]
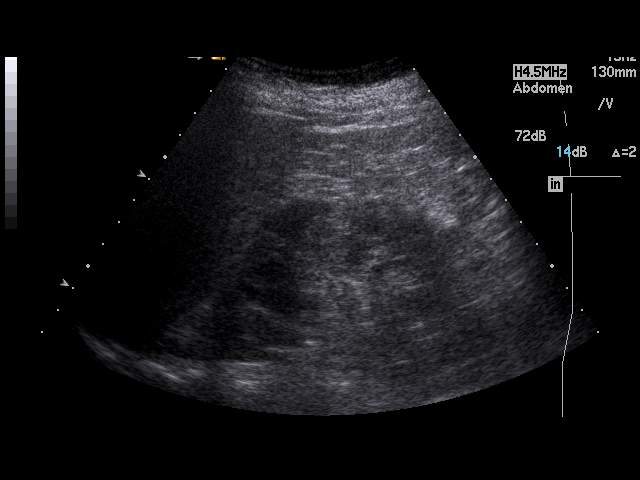
[im 81/114]
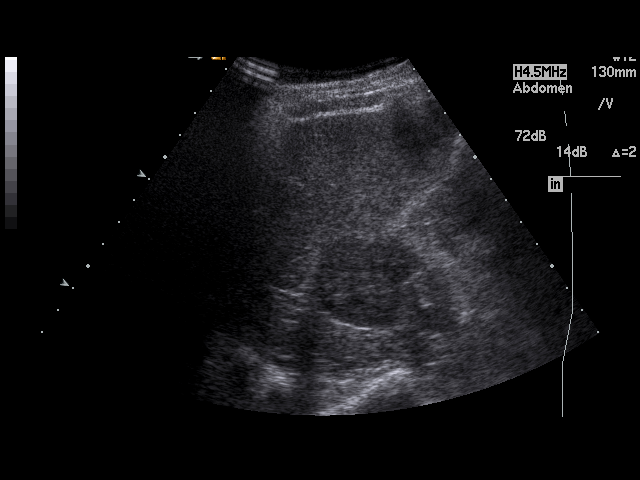
[im 90/114]
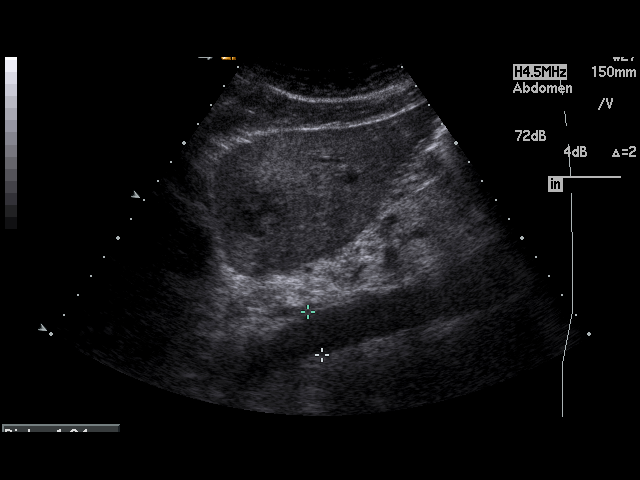
[im 99/114]
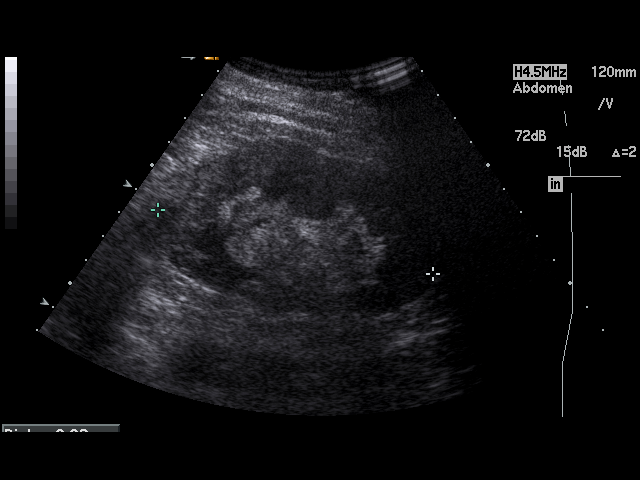
[im 104/114]
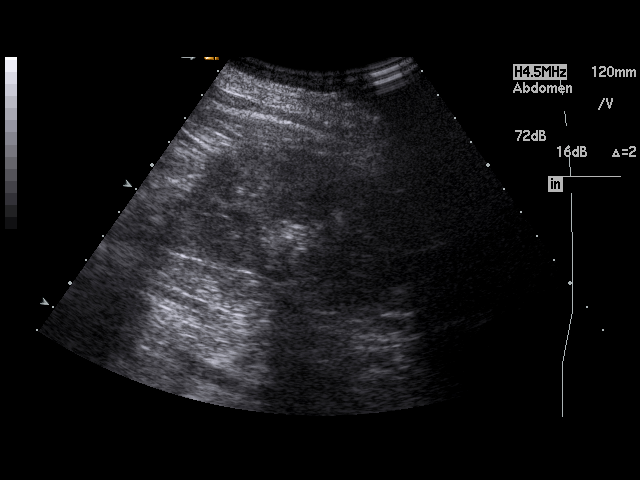
[im 114/114]
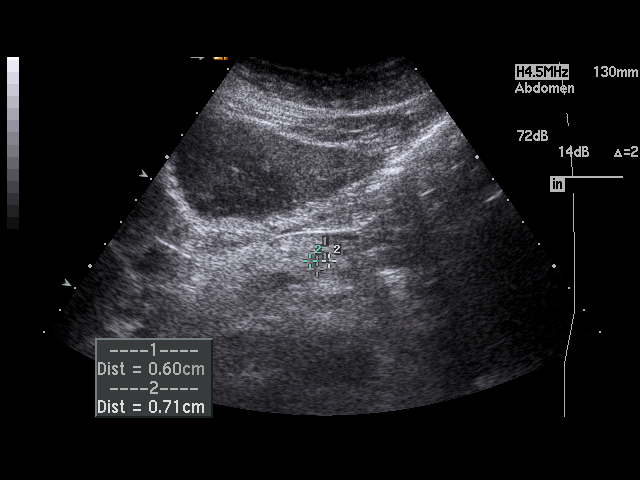

[16 of 25 positions shown; findings below may reference images not displayed]

PROCEDURE:     US  - US ABDOMEN GENERAL SURVEY  - February 20, 2012  [DATE]

RESULT:

There are multiple hypoechoic, mass-like areas in the liver. The largest is
in the right lobe and measures 3.59 cm at maximum diameter. In the left
lobe, the largest measures 4.22 cm at maximum diameter. The findings are
suspicious for metastatic disease. Additional evaluation by hepatic CT or
hepatic MR is suggested. There is noted a trace of free fluid inferior to
the right lobe of the liver. There are observed a few, small, hypoechoic
areas in the pancreas. The possibility of focal hepatic masses cannot be
excluded. The most prominent hypoechoic area measures 7.1 mm and is in the
pancreatic tail. Additional evaluation of the pancreas by CT or MR is also
suggested. Overall pancreas size appears normal. No fluid about the pancreas
is identified. The abdominal aorta and inferior vena cava show no
significant abnormalities. The spleen size is normal. The gallbladder is not
seen compatible with prior cholecystectomy. The common bile duct measures
5.6 mm in diameter which is within normal limits. The kidneys show no
hydronephrosis. Sagittally, the right kidney measures 10.09 cm and the left
measures 9.82 cm.
IMPRESSION: 1.  There are multiple, hypoechoic, mass-like areas in the liver. Metastatic
disease would be the primary consideration at this point.
2.  There are a few, tiny, hypoechoic areas noted in the pancreas. These
areas are not well seen but appear likely to represent focal pancreatic
masses or possibly focal areas of pancreatitis. The largest is in the
pancreatic tail and measures 7.1 mm in diameter.
3.  There is a trace of ascites inferior to the liver.
4.  The patient is status post cholecystectomy.

## 2014-08-10 ENCOUNTER — Encounter: Payer: Self-pay | Admitting: Internal Medicine

## 2015-02-25 NOTE — Consult Note (Signed)
Reason for Visit: This 81 year old Female patient presents to the clinic for initial evaluation of  Stage IV colon cancer .   Referred by Dr. Ma Hillock.  Diagnosis:   Chief Complaint/Diagnosis   81 year old female with obstructing mass in the hepatic flexure biopsy positive for adenocarcinoma with pulmonary and hepatic metastasis.   Pathology Report Pathology report reviewed    Imaging Report CT scan reviewed    Referral Report Clinical notes reviewed    Planned Treatment Regimen Possible palliative radiation therapy to hepatic flexure mass    HPI   patient is a 81 year old female with multiple comorbidities who presented with an intentional weight loss nausea vomiting and decreased appetite over the past several months. She underwent ultrasound of the abdomen on 02/20/12 showing masslike structure in the liver suspicious for metastatic disease. CT scan demonstrated a mass at the hepatic flexure as well as bilateral pulmonary metastasis and liver metastasis. She also had a 4.6 cm complex mass in the pancreas involving the distal pancreatic body as well as a mass at the hepatic flexure. She underwent lower endoscopy by Dr. Hervey Ard finding and near obstructing mass in the hepatic flexure biopsy positive for adenocarcinoma. She is seen today for consideration of possible palliative radiation therapy to the colonic mass. This point she is having significant abdominal pain. At this point patient and husband have refuse surgical intervention and chemotherapy. I have asked to evaluate her for possibility of palliative treatment to prevent obstruction.  Past Hx:    Melanoma Left shoulder:    Diabetes Mellitus, Type II (NIDD):    Schizoaffective disorder:    Depression:    Cholecystectomy:    Appendectomy:   Past, Family and Social History:   Past Medical History positive    Cardiovascular hyperlipidemia    Endocrine diabetes mellitus    Neurological/Psychiatric anxiety;  depression; Schizoaffective disorder    Past Surgical History appendectomy; cholecystectomy; Melanoma removal from left shoulder    Family History positive    Family History Comments Positive for melanoma    Social History noncontributory    Additional Past Medical and Surgical History Accompanied by her husband today   Allergies:   Codeine: Rash  Home Meds:  Home Medications: Medication Instructions Status  Duragesic-12 12 mcg/hr film, extended release 1 PATCH transdermal every 72 hours x 30 days Active  carisoprodol 350 mg oral tablet 1 tab(s) orally 2 times a day, As Needed for spasms Active  midodrine 2.5 mg oral tablet 1 tab(s) orally 3 times a day Active  Seroquel 50 mg oral tablet 1 tab(s) orally 2 times a day Active  sertraline 100 mg oral tablet 1 tab(s) orally once a day Active  simvastatin 40 mg oral tablet 1 tab(s) orally once a day (at bedtime) Active  oxycodone 5 mg oral tablet 1 tab(s) orally every 6 hours Active   Review of Systems:   General negative    Performance Status (ECOG) 2    Skin negative    Breast negative    Ophthalmologic negative    ENMT negative    Respiratory and Thorax negative    Cardiovascular see HPI    Gastrointestinal see HPI    Genitourinary negative    Musculoskeletal negative    Neurological negative    Psychiatric see HPI    Hematology/Lymphatics negative    Endocrine see HPI    Allergic/Immunologic negative   Physical Exam:  General/Skin/HEENT:   Eyes normal    ENMT normal    Head  and Neck normal    Additional PE Thin frail female in recumbent position in considerable no abdominal pain. Lungs are clear to A&P cardiac examination shows regular rate and rhythm. Abdomen is tense. There no bowel sounds noted. Abdomen is tender to touch especially in the right quadrant.   Breasts/Resp/CV/GI/GU:   Respiratory and Thorax normal    Cardiovascular normal    Genitourinary normal   MS/Neuro/Psych/Lymph:    Musculoskeletal normal    Neurological normal    Lymphatics normal   Other Results:  Radiology Results: CT:    26-Apr-13 10:47, CT Chest, Abd, and Pelvis With Contrast   CT Chest, Abd, and Pelvis With Contrast    REASON FOR EXAM:    nausea weight loss abnormal [place] metastatic disease  COMMENTS:       PROCEDURE: KCT - KCT CHEST ABDOMEN AND PELVIS W  - Feb 27 2012 10:47AM     RESULT: CT CHEST, ABDOMEN, AND PELVIS    History: Weight loss    Comparison: None    Technique: Multiple axial images obtained from the thoracic inlet to the   pubic symphysis, with p.o. contrast and with 85 mL ml of Isovue- 300   intravenous contrast.    Findings:    CHEST:    There are numerous bilateral pulmonary nodules with the largest in the   left lower lobe measuring 13 mm.    The heart size is normal. There is no pericardial effusion.     There are no pathologically enlarged mediastinal, hilar, or axillary   lymph nodes.     The osseous structures demonstrate no focal abnormality.     ABDOMEN/PELVIS:  The liver demonstrates no focal abnormality. There is no intrahepatic or   extrahepatic biliary ductal dilatation. The gallbladder is a there are   numerous hypodense masses throughout the liver concerning for metastatic   disease.. The spleen demonstrates no focal abnormality. There is a 4.6 x   3.6 cm complex cystic pancreatic mass involving the distal pancreatic   body. The kidneys and adrenal glands are normal. The bladder is   unremarkable.     There is irregular bowel wall thickening involving the proximal ascending   colon and cecum with pericolonic inflammatory change. This may represent   a focal area of colitis, but the appearance is concerning for underlying   malignancy. Recommend further evaluation withcolonoscopy. There is no   pneumoperitoneum, pneumatosis, or portal venous gas. There is no   abdominal or pelvic free fluid. There is no lymphadenopathy.     The abdominal  aorta is normal in caliber with atherosclerosis.    The osseous structures are unremarkable.    IMPRESSION:     1. There is irregular bowel wall thickening involving the proximal   ascending colon and cecum with pericolonic inflammatory change. This may   represent a focal area of colitis, but the appearance is concerning for   underlying malignancy given the multiple bilateral pulmonary nodules and   multiple hepatic masses. The multiple pulmonary nodules and hepatic   masses are concerning for metastatic disease. Recommend further   evaluation with colonoscopy.    2. There is a 4.6 x 3.6 cm complex cystic pancreatic mass. The pancreatic     mass is concerning for pancreatic malignancy. Recommend oncology   consultation.    Dictation Site: 1          Verified By: Jennette Banker, M.D., MD   Assessment and Plan:  Impression:   obstructing  right colon cancer a 81 year old female refusing multiple treatment options.patient has stage IV disease based on liver and pulmonary metastasis.  Plan:   at this time patient is having plain films were abdomen for workup of possible obstruction. If that is the case may need surgical intervention at this time. Otherwise would consider palliative radiation therapy to the right: Mass. Would treat 3000 cGy in 10 fractions and evaluate for response. Certainly is an unusual situation would you to radiation to prevent or curtail obstruction although with refusal for chemotherapy and surgical intervention believe this may be a viable option to help ease some of her current problems. Patient certainly will be a hospice candidate in the near future and that evaluation will be decided by Dr. Ma Hillock. I have set the patient at that tentatively for CT simulation tomorrow. Plan a rapid course of radiation therapy 3000 cGy in 10 fractions as described above. Risks and benefits of treatment were reviewed with the patient and her husband.  I would like to take this  opportunity to thank you for allowing me to continue to participate in this patient's care.  CC Referral:   cc: Dr. Nicole Cella   Electronic Signatures: Baruch Gouty, Roda Shutters (MD)  (Signed 15-May-13 13:40)  Authored: HPI, Diagnosis, Past Hx, PFSH, Allergies, Home Meds, ROS, Physical Exam, Other Results, Encounter Assessment and Plan, CC Referring Physician   Last Updated: 15-May-13 13:40 by Armstead Peaks (MD)
